# Patient Record
Sex: Female | Born: 1960 | Race: White | Hispanic: No | Marital: Single | Smoking: Current every day smoker
Health system: Southern US, Community
[De-identification: ages and names within clinical notes are randomized; demographics above are authoritative.]

## PROBLEM LIST (undated history)

## (undated) DIAGNOSIS — Z8709 Personal history of other diseases of the respiratory system: Secondary | ICD-10-CM

## (undated) DIAGNOSIS — J449 Chronic obstructive pulmonary disease, unspecified: Secondary | ICD-10-CM

## (undated) DIAGNOSIS — G894 Chronic pain syndrome: Secondary | ICD-10-CM

## (undated) DIAGNOSIS — J40 Bronchitis, not specified as acute or chronic: Secondary | ICD-10-CM

## (undated) DIAGNOSIS — Z7901 Long term (current) use of anticoagulants: Secondary | ICD-10-CM

## (undated) DIAGNOSIS — G5632 Lesion of radial nerve, left upper limb: Secondary | ICD-10-CM

## (undated) DIAGNOSIS — D6862 Lupus anticoagulant syndrome: Secondary | ICD-10-CM

## (undated) DIAGNOSIS — M810 Age-related osteoporosis without current pathological fracture: Secondary | ICD-10-CM

## (undated) DIAGNOSIS — I82409 Acute embolism and thrombosis of unspecified deep veins of unspecified lower extremity: Secondary | ICD-10-CM

## (undated) HISTORY — DX: Lupus anticoagulant syndrome: D68.62

## (undated) HISTORY — DX: Lesion of radial nerve, left upper limb: G56.32

## (undated) HISTORY — PX: LEG AMPUTATION BELOW KNEE: SHX694

## (undated) HISTORY — PX: GASTRIC BYPASS: SHX52

## (undated) HISTORY — PX: TONSILLECTOMY: SUR1361

## (undated) HISTORY — DX: Age-related osteoporosis without current pathological fracture: M81.0

## (undated) HISTORY — PX: CHOLECYSTECTOMY: SHX55

## (undated) HISTORY — DX: Personal history of other diseases of the respiratory system: Z87.09

## (undated) HISTORY — DX: Chronic pain syndrome: G89.4

---

## 2004-05-10 ENCOUNTER — Emergency Department (HOSPITAL_COMMUNITY): Admission: EM | Admit: 2004-05-10 | Discharge: 2004-05-10 | Payer: Self-pay | Admitting: Emergency Medicine

## 2004-05-18 ENCOUNTER — Emergency Department (HOSPITAL_COMMUNITY): Admission: EM | Admit: 2004-05-18 | Discharge: 2004-05-18 | Payer: Self-pay | Admitting: Emergency Medicine

## 2004-10-23 ENCOUNTER — Ambulatory Visit: Payer: Self-pay | Admitting: Family Medicine

## 2004-11-02 ENCOUNTER — Ambulatory Visit: Payer: Self-pay | Admitting: Internal Medicine

## 2004-11-09 ENCOUNTER — Ambulatory Visit: Payer: Self-pay | Admitting: Family Medicine

## 2004-11-20 ENCOUNTER — Ambulatory Visit: Payer: Self-pay | Admitting: Family Medicine

## 2008-12-24 ENCOUNTER — Ambulatory Visit: Payer: Self-pay | Admitting: Radiology

## 2008-12-24 ENCOUNTER — Emergency Department (HOSPITAL_BASED_OUTPATIENT_CLINIC_OR_DEPARTMENT_OTHER): Admission: EM | Admit: 2008-12-24 | Discharge: 2008-12-24 | Payer: Self-pay | Admitting: Emergency Medicine

## 2009-02-10 ENCOUNTER — Inpatient Hospital Stay (HOSPITAL_COMMUNITY): Admission: EM | Admit: 2009-02-10 | Discharge: 2009-02-10 | Payer: Self-pay | Admitting: Internal Medicine

## 2009-02-10 ENCOUNTER — Ambulatory Visit: Payer: Self-pay | Admitting: Diagnostic Radiology

## 2009-02-10 ENCOUNTER — Ambulatory Visit: Payer: Self-pay | Admitting: *Deleted

## 2009-02-10 ENCOUNTER — Encounter (INDEPENDENT_AMBULATORY_CARE_PROVIDER_SITE_OTHER): Payer: Self-pay | Admitting: Internal Medicine

## 2009-02-10 ENCOUNTER — Encounter: Payer: Self-pay | Admitting: Emergency Medicine

## 2010-10-10 LAB — DIFFERENTIAL
Basophils Absolute: 0.1 K/uL (ref 0.0–0.1)
Basophils Relative: 1 % (ref 0–1)
Eosinophils Absolute: 0.1 K/uL (ref 0.0–0.7)
Eosinophils Relative: 1 % (ref 0–5)
Lymphocytes Relative: 25 % (ref 12–46)
Lymphs Abs: 1.7 K/uL (ref 0.7–4.0)
Monocytes Absolute: 0.6 K/uL (ref 0.1–1.0)
Monocytes Relative: 9 % (ref 3–12)
Neutro Abs: 4.2 K/uL (ref 1.7–7.7)
Neutrophils Relative %: 63 % (ref 43–77)

## 2010-10-10 LAB — POCT CARDIAC MARKERS
CKMB, poc: <1 ng/mL — ABNORMAL LOW (ref 1.0–8.0)
Myoglobin, poc: 26.1 ng/mL (ref 12–200)
Troponin i, poc: <0.05 ng/mL (ref 0.00–0.09)

## 2010-10-10 LAB — CBC
HCT: 39 % (ref 36.0–46.0)
Hemoglobin: 13.5 g/dL (ref 12.0–15.0)
MCHC: 34.5 g/dL (ref 30.0–36.0)
MCV: 95.8 fL (ref 78.0–100.0)
RDW: 13.9 % (ref 11.5–15.5)

## 2010-10-10 LAB — PROTIME-INR
INR: 1 (ref 0.00–1.49)
Prothrombin Time: 13.4 s (ref 11.6–15.2)

## 2010-10-10 LAB — BASIC METABOLIC PANEL
CO2: 28 mEq/L (ref 19–32)
Chloride: 104 mEq/L (ref 96–112)
Glucose, Bld: 85 mg/dL (ref 70–99)
Potassium: 4 mEq/L (ref 3.5–5.1)
Sodium: 140 mEq/L (ref 135–145)

## 2010-10-10 LAB — APTT: aPTT: 24 s (ref 24–37)

## 2010-11-17 NOTE — H&P (Signed)
NAMEJOLI, KOOB         ACCOUNT NO.:  [id number]   MEDICAL RECORD NO.:  [id number]          PATIENT TYPE:  INP   LOCATION:                               FACILITY:  [facility]   PHYSICIAN:  Renee Ramus, MD       DATE OF BIRTH:  June 13, 1961   DATE OF ADMISSION:  02/10/2009  DATE OF DISCHARGE:  02/10/2009                              HISTORY & PHYSICAL   HISTORY OF PRESENT ILLNESS:  The patient is a 50 year old female with  previous history of lower extremity clot.  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  The patient was supposed to  be on a Coumadin regimen for lifelong treatment, but stopped her  Coumadin secondary to financial concerns.  The patient has been off  Coumadin for several months.  She noticed that her left lower extremity  became swollen and painful.  The patient presented to the emergency  department and there she was thought to have had a DVT.  She was  transferred to [facility].  She received Doppler ultrasound and DVT was  confirmed.  The patient is now being discharged home with instructions  to follow up with her primary care physician regarding PT/INR checks.  The patient will be on Coumadin lifelong.   PAST MEDICAL HISTORY:  1. DVT.  2. Chronic back pain.   SOCIAL HISTORY:  The patient denies alcohol use, but admits to smoking  1/2 to 1 pack per day.   FAMILY HISTORY:  Not available.   REVIEW OF SYSTEMS:  All other comprehensive review systems are negative.   MEDICATIONS:  The patient has no known drug allergies.   CURRENT MEDICATIONS:  1. Coumadin which she has not been taking.  2. Albuterol 2 puffs inhaled q.6 h. p.r.n. respiratory distress.   PHYSICAL EXAMINATION:  GENERAL:  This is a well-developed, well-  nourished white female, currently in no apparent distress.  VITAL SIGNS:  Blood pressure 121/86, heart rate 72, respiratory rate 18,  and temperature 98.3.  She is 98% sat on room air.  HEENT:  No jugular  venous distention or lymphadenopathy.  Oropharynx is  clear.  Mucous membranes are pink and moist.  TMs are clear bilaterally.  Pupils are equal and reactive to light and accommodation.  Extraocular  muscles are intact.  CARDIOVASCULAR:  Regular rate and rhythm without murmurs, rubs, or  gallops.  PULMONARY:  Lungs are clear to auscultation bilaterally.  ABDOMEN:  Soft, nontender, and nondistended without hepatosplenomegaly.  Bowel sounds are present.  She has no rebound or guarding.  EXTREMITIES:  No clubbing, cyanosis, or edema.  NEURO:  Cranial nerves II-XII are grossly intact.  She has no focal  neurological deficits.   STUDIES:  1. Lower extremity ultrasound showing clot present in the common      femoral vein and in the left lower extremity.  All other veins      appeared thrombosis free.  2. Chest x-ray shows no acute disease.   LABORATORY DATA:  White count 6.7, H&H 13.5 and 39, MCV 95, and  platelets 129.  Sodium 140, potassium 4.0, chloride 104, bicarb  28, BUN  7, creatinine 0.6, and glucose 85.   ASSESSMENT/PLAN:  1. Deep vein thrombosis:  As above, we will discharge the patient to      home with follow up with primary care physician.  She understands      the nature of this treatment.  She is comfortable with this plan.  2. Chronic back pain, currently stable.  3. Tobacco use:  Counsel the patient with respect to cigarette      smoking.  4. Disposition:  The patient will be discharged now.   PRIMARY DISCHARGE DIAGNOSIS:  Lower extremity deep vein thrombosis.   SECONDARY DIAGNOSES:  1. Chronic back pain.  2. Tobacco abuse.   DISCHARGE MEDICATIONS:  1. Albuterol 1-2 puffs inhaled q.6 h. p.r.n. pain.  2. Vicodin 5/500 one to two p.o. q.6 h. p.r.n. pain.  3. Coumadin 5 mg p.o. daily.  4. Lovenox 80 mg subcu b.i.d.   There are no labs or studies pending at the time of discharge.  The  patient is in stable condition and anxious for discharge.   Time spent 35  minutes.      Renee Ramus, MD  Electronically Signed     JF/MEDQ  D:  02/10/2009  T:  02/11/2009  Job:  [id number]

## 2011-06-01 ENCOUNTER — Ambulatory Visit (HOSPITAL_BASED_OUTPATIENT_CLINIC_OR_DEPARTMENT_OTHER)
Admission: RE | Admit: 2011-06-01 | Discharge: 2011-06-01 | Disposition: A | Discharge status: Home / Self Care | Payer: Private Health Insurance - Indemnity | Source: Ambulatory Visit | Attending: Internal Medicine | Admitting: Internal Medicine

## 2011-06-01 ENCOUNTER — Other Ambulatory Visit (HOSPITAL_BASED_OUTPATIENT_CLINIC_OR_DEPARTMENT_OTHER): Payer: Self-pay | Admitting: Internal Medicine

## 2011-06-01 DIAGNOSIS — I82409 Acute embolism and thrombosis of unspecified deep veins of unspecified lower extremity: Secondary | ICD-10-CM

## 2011-06-01 DIAGNOSIS — M7989 Other specified soft tissue disorders: Secondary | ICD-10-CM | POA: Insufficient documentation

## 2011-06-01 DIAGNOSIS — Z86718 Personal history of other venous thrombosis and embolism: Secondary | ICD-10-CM

## 2011-06-01 DIAGNOSIS — Z7901 Long term (current) use of anticoagulants: Secondary | ICD-10-CM

## 2011-06-01 DIAGNOSIS — M79609 Pain in unspecified limb: Secondary | ICD-10-CM | POA: Insufficient documentation

## 2012-11-12 IMAGING — US US EXTREM LOW VENOUS*R*
1 series · 14 of 24 positions shown · non-contrast
Comparison: None.

CLINICAL DATA: Severe pain, redness and swelling in the dorsum of
the right foot for the past week.  History of multiple left leg
DVTs.  The patient takes Warfarin.



[Series 1: us extrem low venous*right* · 14 of 28 slices shown]
[im 1/28]
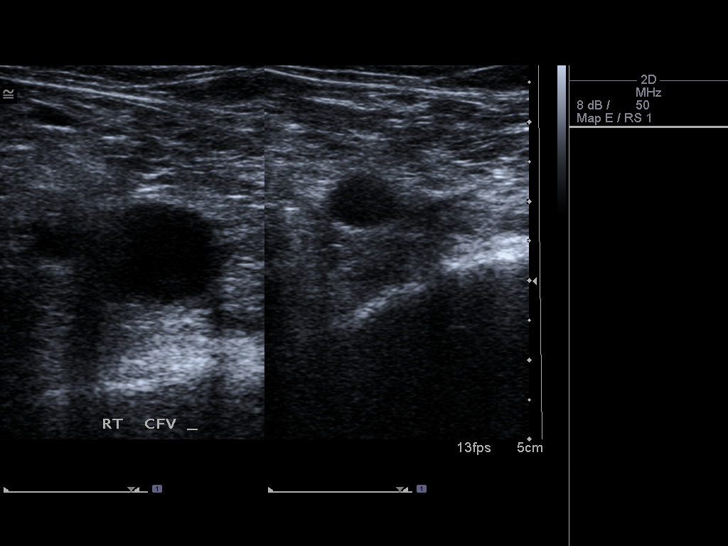
[im 3/28]
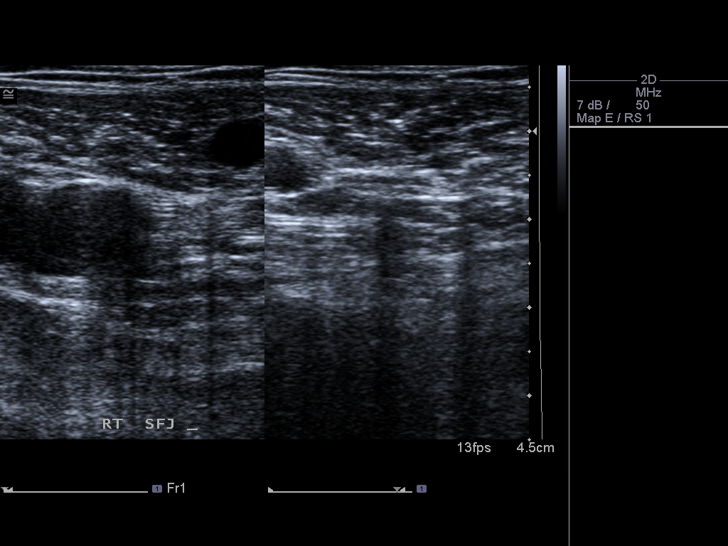
[im 5/28]
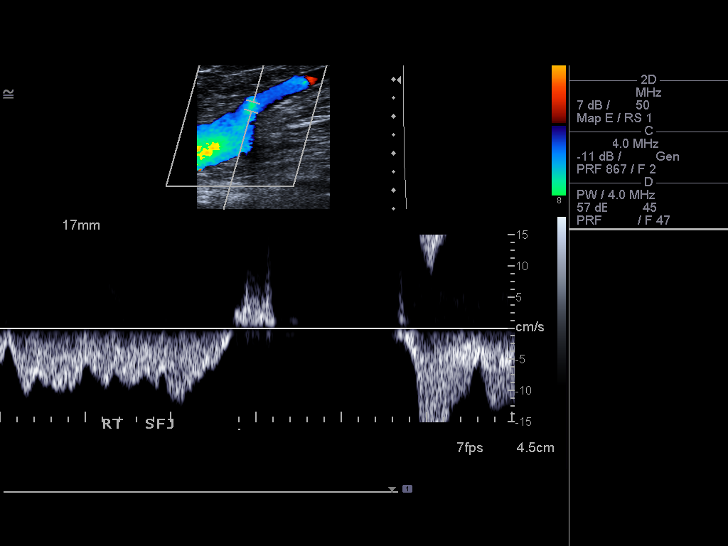
[im 8/28]
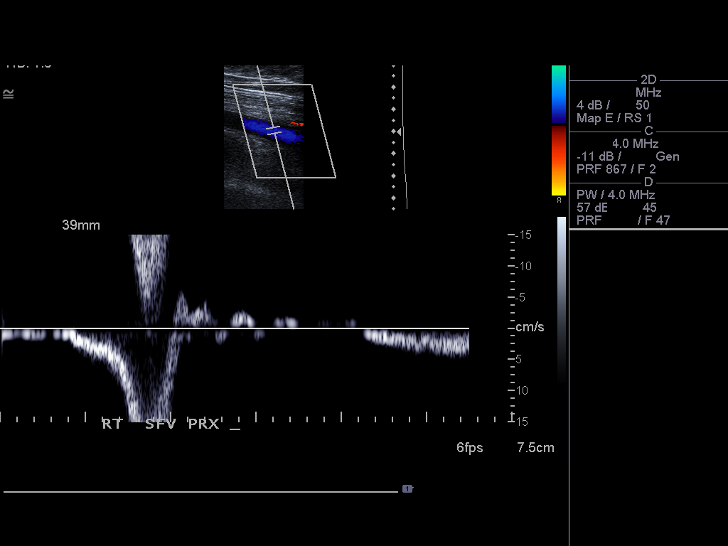
[im 9/28]
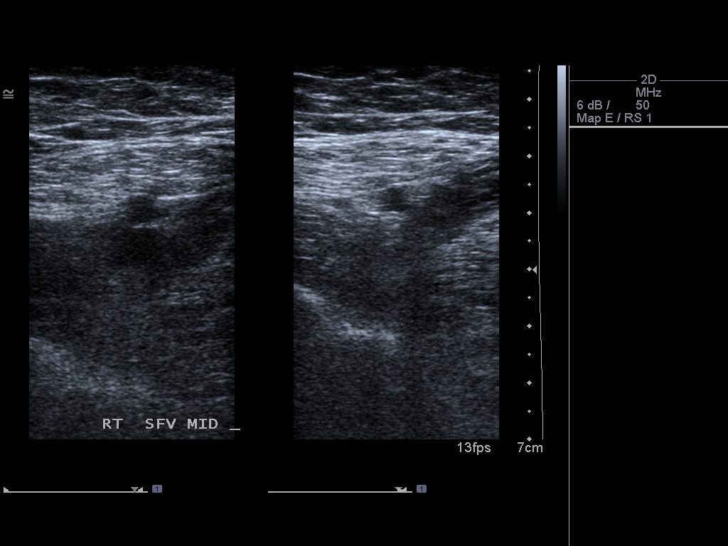
[im 11/28]
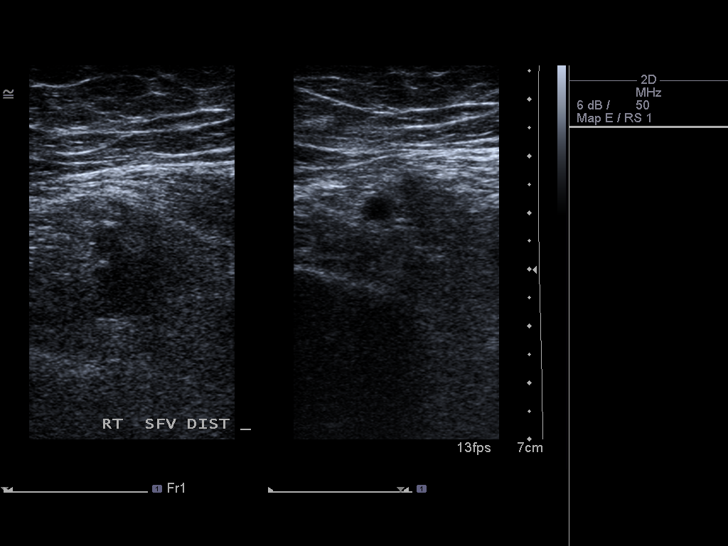
[im 13/28]
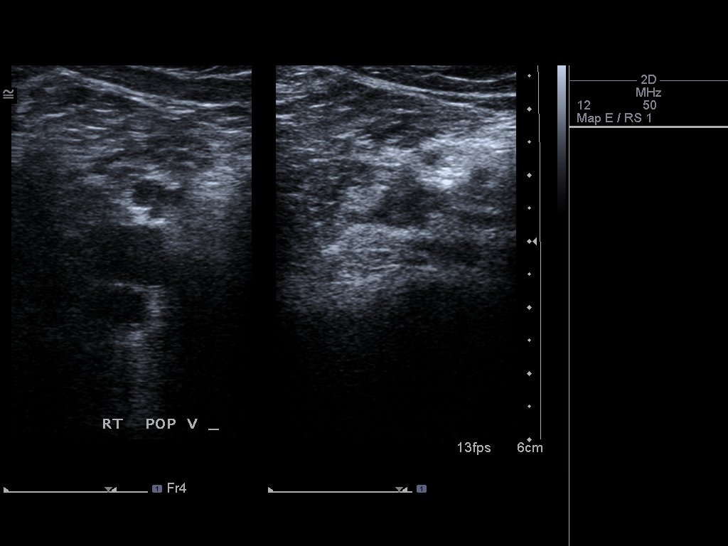
[im 15/28]
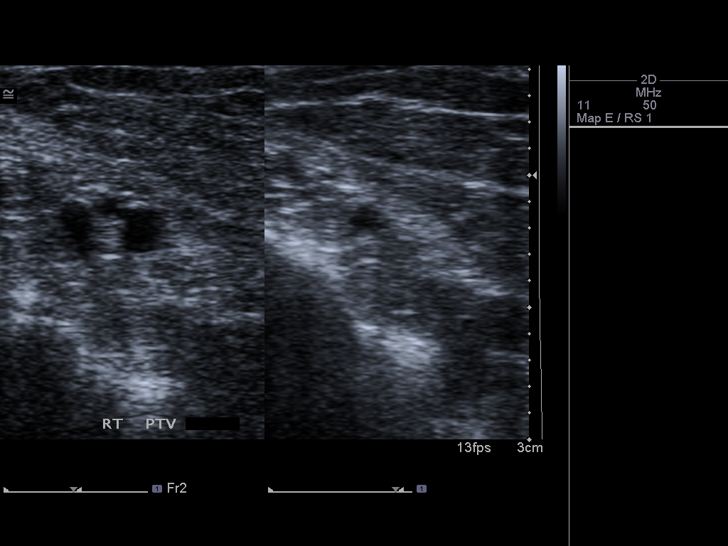
[im 17/28]
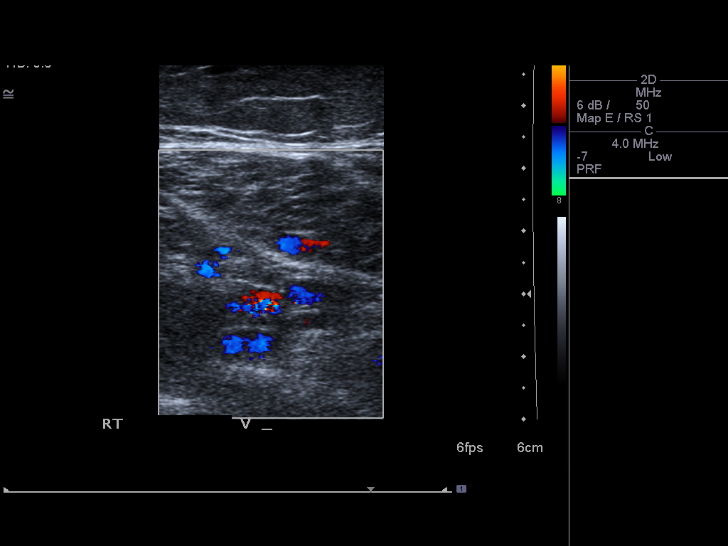
[im 19/28]
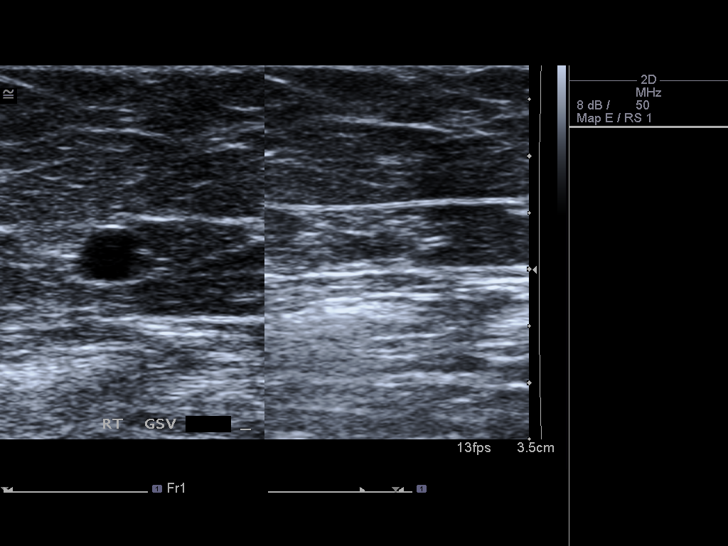
[im 22/28]
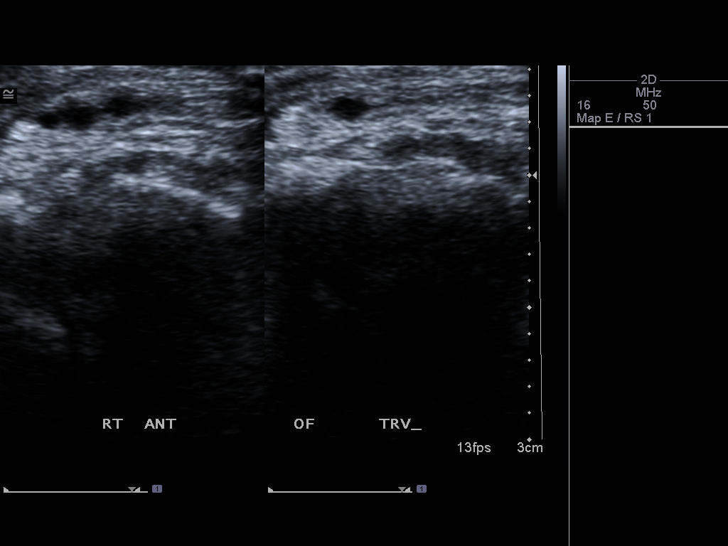
[im 23/28]
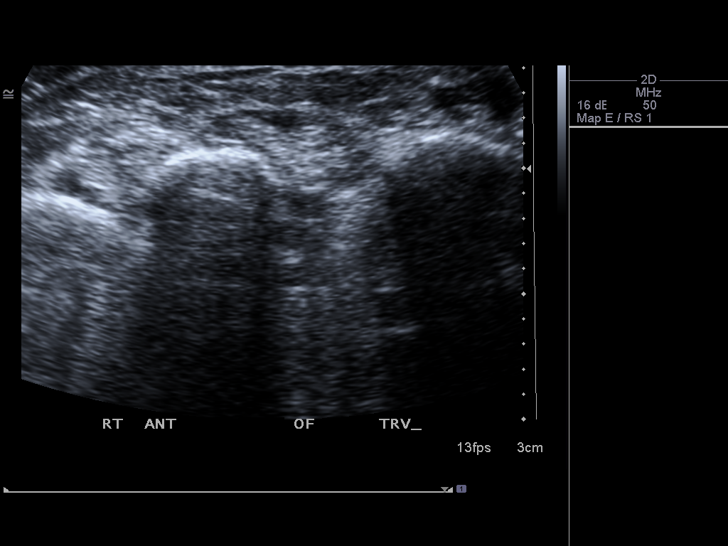
[im 25/28]
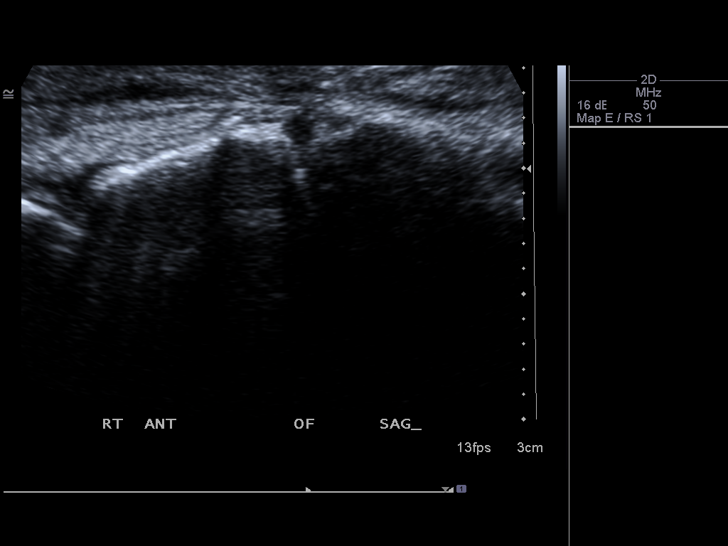
[im 28/28]
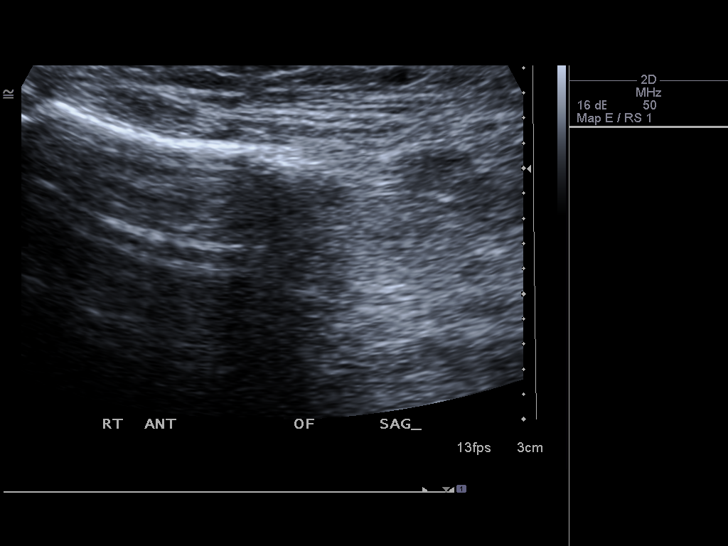

[14 of 24 positions shown; findings below may reference images not displayed]

FINDINGS: Normal compressibility of  the right common femoral,
superficial femoral, and popliteal veins is demonstrated, as well
as the visualized proximal calf veins.  No filling defects to
suggest DVT on grayscale or color Doppler imaging.  Doppler
waveforms show normal direction of venous flow, normal respiratory
phasicity and response to augmentation.
IMPRESSION: No evidence of  right lower extremity deep vein thrombosis.

## 2013-04-25 ENCOUNTER — Encounter (HOSPITAL_COMMUNITY): Payer: Self-pay | Admitting: Emergency Medicine

## 2013-04-25 ENCOUNTER — Emergency Department (HOSPITAL_COMMUNITY): Payer: Managed Care, Other (non HMO)

## 2013-04-25 ENCOUNTER — Emergency Department (HOSPITAL_COMMUNITY)
Admission: EM | Admit: 2013-04-25 | Discharge: 2013-04-25 | Disposition: A | Discharge status: Home / Self Care | Payer: Managed Care, Other (non HMO) | Attending: Emergency Medicine | Admitting: Emergency Medicine

## 2013-04-25 DIAGNOSIS — R209 Unspecified disturbances of skin sensation: Secondary | ICD-10-CM | POA: Insufficient documentation

## 2013-04-25 DIAGNOSIS — M6281 Muscle weakness (generalized): Secondary | ICD-10-CM | POA: Diagnosis present

## 2013-04-25 DIAGNOSIS — R51 Headache: Secondary | ICD-10-CM | POA: Insufficient documentation

## 2013-04-25 DIAGNOSIS — J449 Chronic obstructive pulmonary disease, unspecified: Secondary | ICD-10-CM | POA: Insufficient documentation

## 2013-04-25 DIAGNOSIS — J4489 Other specified chronic obstructive pulmonary disease: Secondary | ICD-10-CM | POA: Insufficient documentation

## 2013-04-25 DIAGNOSIS — Z86718 Personal history of other venous thrombosis and embolism: Secondary | ICD-10-CM | POA: Diagnosis not present

## 2013-04-25 DIAGNOSIS — Z7901 Long term (current) use of anticoagulants: Secondary | ICD-10-CM | POA: Insufficient documentation

## 2013-04-25 DIAGNOSIS — Z79899 Other long term (current) drug therapy: Secondary | ICD-10-CM | POA: Insufficient documentation

## 2013-04-25 DIAGNOSIS — F17200 Nicotine dependence, unspecified, uncomplicated: Secondary | ICD-10-CM | POA: Insufficient documentation

## 2013-04-25 DIAGNOSIS — R29898 Other symptoms and signs involving the musculoskeletal system: Secondary | ICD-10-CM

## 2013-04-25 HISTORY — DX: Bronchitis, not specified as acute or chronic: J40

## 2013-04-25 HISTORY — DX: Chronic obstructive pulmonary disease, unspecified: J44.9

## 2013-04-25 HISTORY — DX: Acute embolism and thrombosis of unspecified deep veins of unspecified lower extremity: I82.409

## 2013-04-25 LAB — COMPREHENSIVE METABOLIC PANEL
AST: 21 U/L (ref 0–37)
Albumin: 3.3 g/dL — ABNORMAL LOW (ref 3.5–5.2)
Alkaline Phosphatase: 98 U/L (ref 39–117)
BUN: 5 mg/dL — ABNORMAL LOW (ref 6–23)
CO2: 27 mEq/L (ref 19–32)
Chloride: 96 mEq/L (ref 96–112)
Creatinine, Ser: 0.42 mg/dL — ABNORMAL LOW (ref 0.50–1.10)
GFR calc Af Amer: >90 mL/min (ref >90)
GFR calc non Af Amer: >90 mL/min (ref >90)
Potassium: 3.6 mEq/L (ref 3.5–5.1)
Total Bilirubin: 0.3 mg/dL (ref 0.3–1.2)

## 2013-04-25 LAB — POCT I-STAT TROPONIN I: Troponin i, poc: 0 ng/mL (ref 0.00–0.08)

## 2013-04-25 LAB — RAPID URINE DRUG SCREEN, HOSP PERFORMED
Amphetamines: NOT DETECTED
Barbiturates: NOT DETECTED
Opiates: NOT DETECTED
Tetrahydrocannabinol: NOT DETECTED

## 2013-04-25 LAB — DIFFERENTIAL
Basophils Relative: 1 % (ref 0–1)
Eosinophils Relative: 1 % (ref 0–5)
Monocytes Relative: 6 % (ref 3–12)
Neutro Abs: 4.8 10*3/uL (ref 1.7–7.7)
Neutrophils Relative %: 69 % (ref 43–77)

## 2013-04-25 LAB — CBC
Hemoglobin: 10 g/dL — ABNORMAL LOW (ref 12.0–15.0)
MCHC: 30.1 g/dL (ref 30.0–36.0)
Platelets: 426 10*3/uL — ABNORMAL HIGH (ref 150–400)
RBC: 4.39 MIL/uL (ref 3.87–5.11)

## 2013-04-25 LAB — PROTIME-INR
INR: 2.29 — ABNORMAL HIGH (ref 0.00–1.49)
Prothrombin Time: 24.5 seconds — ABNORMAL HIGH (ref 11.6–15.2)

## 2013-04-25 LAB — URINALYSIS, ROUTINE W REFLEX MICROSCOPIC
Protein, ur: NEGATIVE mg/dL
Specific Gravity, Urine: 1.018 (ref 1.005–1.030)
Urobilinogen, UA: 1 mg/dL (ref 0.0–1.0)
pH: 6 (ref 5.0–8.0)

## 2013-04-25 NOTE — ED Notes (Signed)
Pt states that she woke up yesterday morning with left arm weakness and tingling, pin and needles.  Pt states that she isnt able to grip or hold anything with left hand.

## 2013-04-25 NOTE — ED Notes (Signed)
Bed: WA07 Expected date:  Expected time:  Means of arrival:  Comments: Hal E

## 2013-04-25 NOTE — ED Provider Notes (Signed)
CSN: [id number]     Arrival date & time 04/25/13  1400 History   First MD Initiated Contact with Patient 04/25/13 1623     Chief Complaint  Patient presents with  . left arm weakness     HPI Patient presents to the emergency room complaining of left arm weakness associated with tingling. The symptoms started yesterday. Patient noticed that she was having trouble holding objects with her left hand and moving her wrist. She went and saw her primary care doctor who performed a CT scan.  It was normal and the patient was supposed to follow up with her neurologist who she primarily sees for pain management.  Patient called the office there and was told to come to the emergency room. Patient denies any headache, trouble with her speech, trouble with her balance or coordination. She denies any difficulty in her other extremities.  Patient notices primarily she has trouble extending her left breast. She denies any neck pain. Past Medical History  Diagnosis Date  . DVT (deep venous thrombosis)   . COPD (chronic obstructive pulmonary disease)   . Bronchitis    Past Surgical History  Procedure Laterality Date  . Leg amputation below knee     No family history on file. History  Substance Use Topics  . Smoking status: Current Every Day Smoker    Types: Cigarettes  . Smokeless tobacco: Not on file  . Alcohol Use: No   OB History   Grav Para Term Preterm Abortions TAB SAB Ect Mult Living                 Review of Systems  Constitutional: Negative for fever.  HENT: Negative for drooling.   Respiratory: Negative for shortness of breath.   Cardiovascular: Negative for chest pain.  Gastrointestinal: Negative for abdominal pain.  Neurological: Positive for headaches. Negative for tremors, seizures and speech difficulty.       Yesterday she had one but none today  All other systems reviewed and are negative.    Allergies  Review of patient's allergies indicates no known allergies.  Home  Medications   Current Outpatient Rx  Name  Route  Sig  Dispense  Refill  . acetaminophen (TYLENOL) 500 MG tablet   Oral   Take 500 mg by mouth every 6 (six) hours as needed for pain.         Marland Kitchen ADVAIR DISKUS 500-50 MCG/DOSE AEPB   Inhalation   Inhale 1 puff into the lungs 2 (two) times daily.          . DULoxetine (CYMBALTA) 60 MG capsule   Oral   Take 60 mg by mouth daily.          . fentaNYL (DURAGESIC - DOSED MCG/HR) 50 MCG/HR   Transdermal   Place 1 patch onto the skin every 3 (three) days.          Marland Kitchen gabapentin (NEURONTIN) 600 MG tablet   Oral   Take 600 mg by mouth 2 (two) times daily.          Marland Kitchen ipratropium-albuterol (DUONEB) 0.5-2.5 (3) MG/3ML SOLN   Inhalation   Inhale 3 mLs into the lungs every 4 (four) hours as needed (wheezing).          Marland Kitchen oxyCODONE (ROXICODONE) 15 MG immediate release tablet   Oral   Take 15 mg by mouth every 8 (eight) hours as needed for pain.          . VENTOLIN HFA 108 (  90 BASE) MCG/ACT inhaler   Inhalation   Inhale 2 puffs into the lungs every 4 (four) hours as needed for wheezing.          . Vitamin D, Ergocalciferol, (DRISDOL) 50000 UNITS CAPS capsule   Oral   Take 50,000 Units by mouth every Friday.          . warfarin (COUMADIN) 5 MG tablet   Oral   Take 2.5 mg by mouth daily.           BP 117/61  Pulse 93  Temp(Src) 98 F (36.7 C) (Oral)  Resp 16  SpO2 92% Physical Exam  Nursing note and vitals reviewed. Constitutional: She is oriented to person, place, and time. She appears well-developed and well-nourished. No distress.  HENT:  Head: Normocephalic and atraumatic.  Right Ear: External ear normal.  Left Ear: External ear normal.  Mouth/Throat: Oropharynx is clear and moist.  Eyes: Conjunctivae are normal. Right eye exhibits no discharge. Left eye exhibits no discharge. No scleral icterus.  Neck: Neck supple. No tracheal deviation present.  Cardiovascular: Normal rate, regular rhythm and intact  distal pulses.   Pulmonary/Chest: Effort normal and breath sounds normal. No stridor. No respiratory distress. She has no wheezes. She has no rales.  Abdominal: Soft. Bowel sounds are normal. She exhibits no distension. There is no tenderness. There is no rebound and no guarding.  Musculoskeletal: She exhibits no edema and no tenderness.  S/p amputation rle  Neurological: She is alert and oriented to person, place, and time. She has normal strength. No sensory deficit. Cranial nerve deficit:  no gross defecits noted. She exhibits normal muscle tone. She displays no seizure activity. Coordination normal.  Equal grip strength bilateral UE, weakness in extension of right wrist, able to hold both legs off bed for 5 seconds, sensation intact in all extremities, no visual field cuts, no left or right sided neglect  Skin: Skin is warm and dry. No rash noted.  Psychiatric: She has a normal mood and affect.    ED Course  Procedures (including critical care time) Labs Review Labs Reviewed  PROTIME-INR - Abnormal; Notable for the following:    Prothrombin Time 24.5 (*)    INR 2.29 (*)    All other components within normal limits  APTT - Abnormal; Notable for the following:    aPTT 56 (*)    All other components within normal limits  CBC - Abnormal; Notable for the following:    Hemoglobin 10.0 (*)    HCT 33.2 (*)    MCV 75.6 (*)    MCH 22.8 (*)    RDW 19.0 (*)    Platelets 426 (*)    All other components within normal limits  COMPREHENSIVE METABOLIC PANEL - Abnormal; Notable for the following:    Sodium 132 (*)    Glucose, Bld 101 (*)    BUN 5 (*)    Creatinine, Ser 0.42 (*)    Albumin 3.3 (*)    All other components within normal limits  ETHANOL  DIFFERENTIAL  TROPONIN I  URINE RAPID DRUG SCREEN (HOSP PERFORMED)  URINALYSIS, ROUTINE W REFLEX MICROSCOPIC  POCT I-STAT TROPONIN I   Imaging Review Mr Brain Wo Contrast  04/25/2013   CLINICAL DATA:  52 year old female with acute onset  tingling and numbness in the left arm.  EXAM: MRI HEAD WITHOUT CONTRAST  TECHNIQUE: Multiplanar, multisequence MR imaging was performed. No intravenous contrast was administered.  COMPARISON:  [facility] brain MRI 10/30/2004.  FINDINGS: Major intracranial vascular flow voids are stable and within normal limits. Stable cerebral volume. No restricted diffusion to suggest acute infarction. No midline shift, mass effect, evidence of mass lesion, ventriculomegaly, extra-axial collection or acute intracranial hemorrhage. Cervicomedullary junction and pituitary are within normal limits. Negative visualized upper cervical spine.  Scattered small cerebral white matter T2 and FLAIR hyperintense foci are advanced for age, and increased since 2006, but nonspecific. Evidence of a small chronic micro hemorrhage in the left parietal lobe on series 7, image 17. No cortical encephalomalacia. Deep gray matter nuclei, brainstem and cerebellum within normal limits. Visible internal auditory structures appear normal.  Fluid level in the left maxillary sinus. Small volume retained secretions in the nasopharynx. Trace mastoid effusions greater on the left. Mild to moderate right ethmoid and maxillary sinus mucosal thickening.  Visualized orbit soft tissues are within normal limits. Normal bone marrow signal. Visualized scalp soft tissues are within normal limits.  IMPRESSION: 1. No acute intracranial abnormality. Increased nonspecific cerebral white matter signal changes since 2006. Favor chronic small vessel disease.  2. Paranasal sinus inflammatory changes compatible with acute sinusitis. Trace associated mastoid effusions.   Electronically Signed   By: Augusto Gamble M.D.   On: 04/25/2013 18:51   Mr Cervical Spine Wo Contrast  04/25/2013   CLINICAL DATA:  52 year old female with acute onset tingling and numbness in the left arm. Initial encounter.  EXAM: MRI CERVICAL SPINE WITHOUT CONTRAST  TECHNIQUE: Multiplanar,  multisequence MR imaging was performed. No intravenous contrast was administered.  COMPARISON:  None.  FINDINGS: Study is mildly to moderately degraded by motion artifact despite repeated imaging attempts.  Preserved cervical lordosis. Mild endplate marrow edema greater on the right at the posterior C5-C6 endplates appears to be degenerative in nature. Otherwise no acute osseous abnormality.  Cervicomedullary junction is within normal limits. Grossly negative paraspinal soft tissues.  C2-C3: Negative.  C3-C4: Negative.  C4-C5: Mild disc osteophyte complex and ligament flavum hypertrophy. Borderline to mild spinal stenosis. No foraminal stenosis.  C5-C6: Disc osteophyte complex and mild to moderate ligament flavum hypertrophy. Spinal stenosis, no definite spinal cord mass effect or signal abnormality. Right greater than left facet hypertrophy. Mild to moderate C6 foraminal stenosis.  C6-C7: No definite disc degeneration. Moderate ligament flavum hypertrophy. Mild spinal stenosis. No foraminal stenosis.  C7-T1: Negative. No upper thoracic spinal stenosis.  IMPRESSION: Degraded by motion despite repeated imaging attempts, but there is multifactorial mild degenerative cervical spinal stenosis C4-C5 through C6-C7. Suspect up to moderate foraminal stenosis at the bilateral C6 nerve levels. Up to mild spinal cord mass effect is possible at C5-C6. No spinal cord signal abnormality is evident.   Electronically Signed   By: Augusto Gamble M.D.   On: 04/25/2013 18:41    EKG Interpretation     Ventricular Rate:  76 PR Interval:  177 QRS Duration: 85 QT Interval:  366 QTC Calculation: 412 R Axis:   64 Text Interpretation:  Sinus rhythm No significant change since last tracing            MDM   1. Weakness of wrist    The pt's MRIs do not show any stroke or significant cervical spine abnormality to account for her wrist weakness.  Her grip strength is still strong surprisingly.  Will place her in a splint and  refer to neurology for further evaluation.   Celene Kras, MD 04/25/13 [phone]

## 2013-04-25 NOTE — ED Notes (Signed)
Pt back from MRI; walked to the restroom; refused her EKG until she was able to use restroom.

## 2013-04-25 NOTE — ED Notes (Signed)
Unable to perform neuro checks at 1800 and 2000 due to pt being in MRI.

## 2013-05-14 ENCOUNTER — Encounter: Payer: Self-pay | Admitting: Neurology

## 2013-05-15 ENCOUNTER — Encounter: Payer: Self-pay | Admitting: Neurology

## 2013-05-15 ENCOUNTER — Ambulatory Visit (INDEPENDENT_AMBULATORY_CARE_PROVIDER_SITE_OTHER): Payer: Private Health Insurance - Indemnity | Admitting: Neurology

## 2013-05-15 VITALS — BP 106/70 | HR 91 | Ht 68.0 in | Wt 149.0 lb

## 2013-05-15 DIAGNOSIS — G5632 Lesion of radial nerve, left upper limb: Secondary | ICD-10-CM

## 2013-05-15 DIAGNOSIS — G5630 Lesion of radial nerve, unspecified upper limb: Secondary | ICD-10-CM

## 2013-05-15 HISTORY — DX: Lesion of radial nerve, left upper limb: G56.32

## 2013-05-15 NOTE — Patient Instructions (Signed)
Radial Nerve Palsy °Wrist drop is also known as radial nerve palsy. It is a condition in which you can not extend your wrist. This means if you are standing with your elbow bent at a right angle and with the top of your hand pointed at the ceiling, you can not hold your hand up. It falls toward the floor.  °This action of extending your wrist is caused by the muscles in the back of your arm. These muscles are controlled by the radial nerve. This means that anything affecting the radial nerve so it can not tell the muscles how to work will cause wrist drop. This is medically called radial nerve palsy. Also the radial nerve is a motor and sensory nerve so anything affecting it causes problems with movement and feeling. °CAUSES  °Some more common causes of wrist drop are: °· A break (fracture) of the large bone in the arm between your shoulder and your elbow (humerus). This is because the radial nerve winds around the humerus. °· Improper use of crutches causes this because the radial nerve runs through the armpit (axilla). Crutches which are too long can put pressure on the nerve. This is sometimes called crutch palsy. °· Falling asleep with your arm over a chair and supported on the back is a common cause. This is sometimes called Saturday Night Syndrome. °· Wrist drop can be associated with lead poisoning because of the effect of lead on the radial nerve. °SYMPTOMS  °The wrist drop is an obvious problem, but there may also be numbness in the back of the arm, forearm or hand which provides feeling in these areas by the radial nerve. There can be difficulty straightening out the elbow in addition to the wrist. There may be numbness, tingling, pain, burning sensations or other abnormal feelings. Symptoms depend entirely on where the radial nerve is injured. °DIAGNOSIS  °· Wrist drop is obvious just by looking at it. Your caregiver may make the diagnosis by taking your history and doing a couple tests. °· One test which  may be done is a nerve conduction study. This test shows if the radial nerve is conducting signals well. If not, it can determine where the nerve problem is. °· Sometimes X-ray studies are done. Your caregiver will determine if further testing needs to be done. °TREATMENT  °· Usually if the problem is found to be pressure on the nerve, simply removing the pressure will allow the nerve to go back to normal in a few weeks to a few months. Other treatments will depend upon the cause found. °· Only take over-the-counter or prescription medicines for pain, discomfort, or fever as directed by your caregiver. °· Sometimes seizure medications are used. °· Steroids are sometimes given to decrease swelling if it is thought to be a possible cause. °Document Released: 02/25/2006 Document Revised: 09/13/2011 Document Reviewed: 04/07/2006 °[employer]® Patient Information ©2014 [employer]. ° °

## 2013-05-15 NOTE — Progress Notes (Signed)
Reason for visit: Left wrist drop  Robin Prince is a 52 y.o. female  History of present illness:  Robin Prince is a 52 year old right-handed white female with a history of a lupus anticoagulant antibody. The patient is on chronic Coumadin for this reason. The patient developed sudden onset of left wrist drop 3 weeks prior to this evaluation. The patient indicates that she woke up with the problem, with some tingling sensations into the left thumb, and some achy pain into the upper arm on the left. The patient has undergone MRI evaluation of the brain and cervical spine. No evidence of an acute stroke was seen, and mild spinal stenosis is seen at the C4-5 through the C6-7 levels. No definite cord compression or nerve root compression was seen. The patient is sent to this office for further evaluation. The patient denies any problems with the lower extremities, and she denies any issues with the right arm. The patient does not have neck or shoulder discomfort. The patient has a right below-knee amputation, and she uses a cane for ambulation.  Past Medical History  Diagnosis Date  . DVT (deep venous thrombosis)   . COPD (chronic obstructive pulmonary disease)   . Bronchitis   . 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 Lupus anticoagulant disorder   . Osteoporosis   . Chronic pain syndrome     Phantom leg pain, right  . History of pleural effusion     Status post sclerotherapy    Past Surgical History  Procedure Laterality Date  . Leg amputation below knee    . Cholecystectomy    . Tonsillectomy    . Gastric bypass      Family History  Problem Relation Age of Onset  . Cancer Father   . Migraines Sister   . Stroke Sister     Social history:  reports that she has been smoking Cigarettes.  She has been smoking about 0.50 packs per day. She has never used smokeless tobacco. She reports that she does not drink alcohol or use illicit drugs.  Medications:  Current  Outpatient Prescriptions on File Prior to Visit  Medication Sig Dispense Refill  . ADVAIR DISKUS 500-50 MCG/DOSE AEPB Inhale 1 puff into the lungs 2 (two) times daily.       . DULoxetine (CYMBALTA) 60 MG capsule Take 60 mg by mouth daily.       . fentaNYL (DURAGESIC - DOSED MCG/HR) 50 MCG/HR Place 1 patch onto the skin every 3 (three) days.       Marland Kitchen gabapentin (NEURONTIN) 600 MG tablet Take 600 mg by mouth 2 (two) times daily.       Marland Kitchen ipratropium-albuterol (DUONEB) 0.5-2.5 (3) MG/3ML SOLN Inhale 3 mLs into the lungs every 4 (four) hours as needed (wheezing).       Marland Kitchen oxyCODONE (ROXICODONE) 15 MG immediate release tablet Take 15 mg by mouth every 8 (eight) hours as needed for pain.       . VENTOLIN HFA 108 (90 BASE) MCG/ACT inhaler Inhale 2 puffs into the lungs every 4 (four) hours as needed for wheezing.       . Vitamin D, Ergocalciferol, (DRISDOL) 50000 UNITS CAPS capsule Take 50,000 Units by mouth every Friday.       . warfarin (COUMADIN) 5 MG tablet Take 2.5 mg by mouth daily.       Marland Kitchen acetaminophen (TYLENOL) 500 MG tablet Take 500 mg by mouth every 6 (six) hours as needed for pain.      Marland Kitchen  alendronate (FOSAMAX) 70 MG tablet Take 70 mg by mouth once a week.       No current facility-administered medications on file prior to visit.     No Known Allergies  ROS:  Out of a complete 14 system review of symptoms, the patient complains only of the following symptoms, and all other reviewed systems are negative.  Weight loss, fatigue Swelling in the legs Shortness of breath, cough, wheezing Incontinence of bladder Anemia, easy bruising, easy bleeding Increased thirst Allergies, runny nose Numbness, weakness Insomnia, sleepiness  Blood pressure 106/70, pulse 91, height 5\' 8"  (1.727 m), weight 149 lb (67.586 kg).  Physical Exam  General: The patient is alert and cooperative at the time of the examination.  Head: Pupils are equal, round, and reactive to light. Discs are flat  bilaterally.  Neck: The neck is supple, no carotid bruits are noted.  Respiratory: The respiratory examination reveals bilateral wheezes and rhonchi.  Cardiovascular: The cardiovascular examination reveals a regular rate and rhythm, no obvious murmurs or rubs are noted.  Neuromuscular: The patient has full range of movement of the cervical spine.  Skin: Extremities are without significant edema, the patient has a right below-knee amputation. Prosthetic leg is in place.  Neurologic Exam  Mental status: The patient is alert and oriented x 3 the time of the examination.  Cranial nerves: Facial symmetry is present. There is good sensation of the face to pinprick and soft touch bilaterally. The strength of the facial muscles and the muscles to head turning and shoulder shrug are normal bilaterally. Speech is well enunciated, no aphasia or dysarthria is noted. Extraocular movements are full. Visual fields are full.  Motor: The motor testing reveals 5 over 5 strength of all 4 extremities, with exception that there is a wrist drop on the left, weakness with extension of the fingers, and the brachioradialis muscle on the left is not contract. The patient has normal triceps strength bilaterally. Good symmetric motor tone is noted throughout.  Sensory: Sensory testing is intact to pinprick, soft touch, vibration sensation, and position sense on all 4 extremities, with exception that there is some mild decrease in pinprick sensation on the left thumb. No evidence of extinction is noted.  Coordination: Cerebellar testing reveals good finger-nose-finger and heel-to-shin bilaterally.  Gait and station: Gait is of a limping quality, the patient has a right below-knee amputation, prosthetic leg. Tandem gait was not attempted. Romberg is negative. No drift is seen.  Reflexes: Deep tendon reflexes are symmetric and normal bilaterally, but the right ankle jerk reflex could not be tested. Toes are downgoing on  the left.   Assessment/Plan:  1. 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 Lupus anticoagulant antibody, chronic anticoagulation  The patient is on multiple medications that are sedating, and she has been oversedated while sleeping at night. The patient likely has a compressive neuropathy of the radial nerve around the spiral groove. The patient will be set up for nerve conduction studies of both upper extremities, and EMG evaluation of the left arm. Over time, the patient may get her strength back in the left hand over the next 3-6 months. Blood work will be done today.  Marlan Palau MD 05/15/2013 6:35 PM  [facility] [address] [place], [place] [address]  Phone [phone] Fax [phone]

## 2013-05-23 ENCOUNTER — Encounter: Payer: Private Health Insurance - Indemnity | Admitting: Neurology

## 2013-05-25 ENCOUNTER — Emergency Department (HOSPITAL_COMMUNITY)
Admission: EM | Admit: 2013-05-25 | Discharge: 2013-05-26 | Disposition: A | Discharge status: Home / Self Care | Payer: Managed Care, Other (non HMO) | Attending: Emergency Medicine | Admitting: Emergency Medicine

## 2013-05-25 ENCOUNTER — Emergency Department (HOSPITAL_COMMUNITY): Payer: Managed Care, Other (non HMO)

## 2013-05-25 ENCOUNTER — Encounter (HOSPITAL_COMMUNITY): Payer: Self-pay | Admitting: Emergency Medicine

## 2013-05-25 DIAGNOSIS — Z79899 Other long term (current) drug therapy: Secondary | ICD-10-CM | POA: Insufficient documentation

## 2013-05-25 DIAGNOSIS — G8929 Other chronic pain: Secondary | ICD-10-CM | POA: Insufficient documentation

## 2013-05-25 DIAGNOSIS — J4489 Other specified chronic obstructive pulmonary disease: Secondary | ICD-10-CM | POA: Insufficient documentation

## 2013-05-25 DIAGNOSIS — F17200 Nicotine dependence, unspecified, uncomplicated: Secondary | ICD-10-CM | POA: Insufficient documentation

## 2013-05-25 DIAGNOSIS — M25569 Pain in unspecified knee: Secondary | ICD-10-CM | POA: Insufficient documentation

## 2013-05-25 DIAGNOSIS — Z86718 Personal history of other venous thrombosis and embolism: Secondary | ICD-10-CM | POA: Insufficient documentation

## 2013-05-25 DIAGNOSIS — D6859 Other primary thrombophilia: Secondary | ICD-10-CM | POA: Insufficient documentation

## 2013-05-25 DIAGNOSIS — J449 Chronic obstructive pulmonary disease, unspecified: Secondary | ICD-10-CM | POA: Insufficient documentation

## 2013-05-25 DIAGNOSIS — M79604 Pain in right leg: Secondary | ICD-10-CM

## 2013-05-25 DIAGNOSIS — J189 Pneumonia, unspecified organism: Secondary | ICD-10-CM

## 2013-05-25 DIAGNOSIS — J159 Unspecified bacterial pneumonia: Secondary | ICD-10-CM | POA: Insufficient documentation

## 2013-05-25 DIAGNOSIS — G5630 Lesion of radial nerve, unspecified upper limb: Secondary | ICD-10-CM | POA: Insufficient documentation

## 2013-05-25 DIAGNOSIS — S88119A Complete traumatic amputation at level between knee and ankle, unspecified lower leg, initial encounter: Secondary | ICD-10-CM | POA: Insufficient documentation

## 2013-05-25 DIAGNOSIS — Z7901 Long term (current) use of anticoagulants: Secondary | ICD-10-CM | POA: Insufficient documentation

## 2013-05-25 DIAGNOSIS — M810 Age-related osteoporosis without current pathological fracture: Secondary | ICD-10-CM | POA: Insufficient documentation

## 2013-05-25 LAB — URINALYSIS, ROUTINE W REFLEX MICROSCOPIC
Glucose, UA: NEGATIVE mg/dL
Nitrite: NEGATIVE
Protein, ur: NEGATIVE mg/dL
pH: 6 (ref 5.0–8.0)

## 2013-05-25 LAB — CBC WITH DIFFERENTIAL/PLATELET
Basophils Absolute: 0.1 10*3/uL (ref 0.0–0.1)
Basophils Relative: 1 % (ref 0–1)
Eosinophils Relative: 2 % (ref 0–5)
HCT: 32.8 % — ABNORMAL LOW (ref 36.0–46.0)
Lymphs Abs: 2.8 10*3/uL (ref 0.7–4.0)
MCHC: 30.5 g/dL (ref 30.0–36.0)
MCV: 76.1 fL — ABNORMAL LOW (ref 78.0–100.0)
Monocytes Absolute: 0.7 10*3/uL (ref 0.1–1.0)
Neutro Abs: 9.4 10*3/uL — ABNORMAL HIGH (ref 1.7–7.7)
Neutrophils Relative %: 72 % (ref 43–77)
Platelets: 327 10*3/uL (ref 150–400)
RBC: 4.31 MIL/uL (ref 3.87–5.11)
RDW: 19.9 % — ABNORMAL HIGH (ref 11.5–15.5)

## 2013-05-25 LAB — COMPREHENSIVE METABOLIC PANEL
ALT: 9 U/L (ref 0–35)
AST: 15 U/L (ref 0–37)
Albumin: 3.1 g/dL — ABNORMAL LOW (ref 3.5–5.2)
CO2: 25 mEq/L (ref 19–32)
Calcium: 9.1 mg/dL (ref 8.4–10.5)
Chloride: 100 mEq/L (ref 96–112)
GFR calc non Af Amer: >90 mL/min (ref >90)
Sodium: 135 mEq/L (ref 135–145)

## 2013-05-25 LAB — LACTIC ACID, PLASMA: Lactic Acid, Venous: 1.1 mmol/L (ref 0.5–2.2)

## 2013-05-25 LAB — PROTIME-INR: Prothrombin Time: 30.9 seconds — ABNORMAL HIGH (ref 11.6–15.2)

## 2013-05-25 MED ORDER — DEXTROSE 5 % IV SOLN
1.0000 g | INTRAVENOUS | Status: DC
  Administered 2013-05-25: 1 g via INTRAVENOUS
  Filled 2013-05-25: qty 10

## 2013-05-25 MED ORDER — HYDROMORPHONE HCL PF 1 MG/ML IJ SOLN
1.0000 mg | Freq: Once | INTRAMUSCULAR | Status: AC
  Administered 2013-05-25: 1 mg via INTRAVENOUS
  Filled 2013-05-25: qty 1

## 2013-05-25 MED ORDER — AZITHROMYCIN 250 MG PO TABS: ORAL_TABLET | ORAL | Status: DC

## 2013-05-25 MED ORDER — SODIUM CHLORIDE 0.9 % IV BOLUS (SEPSIS)
500.0000 mL | Freq: Once | INTRAVENOUS | Status: AC
  Administered 2013-05-25: 500 mL via INTRAVENOUS

## 2013-05-25 MED ORDER — SODIUM CHLORIDE 0.9 % IV SOLN
INTRAVENOUS | Status: DC
  Administered 2013-05-25: 20 mL/h via INTRAVENOUS

## 2013-05-25 MED ORDER — SODIUM CHLORIDE 0.9 % IV BOLUS (SEPSIS)
1000.0000 mL | Freq: Once | INTRAVENOUS | Status: AC
  Administered 2013-05-25: 1000 mL via INTRAVENOUS

## 2013-05-25 MED ORDER — DEXTROSE 5 % IV SOLN
500.0000 mg | INTRAVENOUS | Status: DC
  Administered 2013-05-25: 500 mg via INTRAVENOUS

## 2013-05-25 MED ORDER — ALBUTEROL SULFATE HFA 108 (90 BASE) MCG/ACT IN AERS: 2.0000 | INHALATION_SPRAY | RESPIRATORY_TRACT | Status: AC | PRN

## 2013-05-25 MED ORDER — AMOXICILLIN 500 MG PO CAPS: 1000.0000 mg | ORAL_CAPSULE | Freq: Three times a day (TID) | ORAL | Status: DC

## 2013-05-25 NOTE — ED Notes (Signed)
Pt. For discharge, awaiting to complete IV antibiotic .

## 2013-05-25 NOTE — ED Provider Notes (Addendum)
CSN: [id number]     Arrival date & time 05/25/13  1747 History   First MD Initiated Contact with Patient 05/25/13 1926     Chief Complaint  Patient presents with  . abnormal bloodwork   . Blood Infection   (Consider location/radiation/quality/duration/timing/severity/associated sxs/prior Treatment) The history is provided by the patient.  pt c/o going to pcp w right stump pain for the past few days. Had labs done yesterday, was called today and told she may have an infection and to come to ED.  Pt states hx peripheral vascular disease, had arterial occlusion ?popliteal, had failed graft, and ended up with bka approximately 1 yr ago. States ever since surgery has had persistent pain in stump area, constant, moderate. States in past few days that pain is worse, prompting recent lab work and xr. Pt denies injury. No redness, increased swelling, or drainage. Pt also notes non productive cough, states has chronic cough, unsure of worse. No sore throat or runny nose. No fever or chills. +smoker    Past Medical History  Diagnosis Date  . DVT (deep venous thrombosis)   . COPD (chronic obstructive pulmonary disease)   . Bronchitis   . 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 Lupus anticoagulant disorder   . Osteoporosis   . Chronic pain syndrome     Phantom leg pain, right  . History of pleural effusion     Status post sclerotherapy   Past Surgical History  Procedure Laterality Date  . Leg amputation below knee    . Cholecystectomy    . Tonsillectomy    . Gastric bypass     Family History  Problem Relation Age of Onset  . Cancer Father   . Migraines Sister   . Stroke Sister    History  Substance Use Topics  . Smoking status: Current Every Day Smoker -- 0.50 packs/day    Types: Cigarettes  . Smokeless tobacco: Never Used  . Alcohol Use: No   OB History   Grav Para Term Preterm Abortions TAB SAB Ect Mult Living                 Review of Systems  Constitutional:  Negative for fever and chills.  HENT: Negative for sore throat.   Eyes: Negative for redness.  Respiratory: Positive for cough. Negative for shortness of breath.   Cardiovascular: Negative for chest pain.  Gastrointestinal: Negative for nausea, vomiting, abdominal pain and diarrhea.  Genitourinary: Negative for dysuria and flank pain.  Musculoskeletal: Negative for back pain and neck pain.  Skin: Negative for rash.  Neurological: Negative for headaches.  Hematological: Does not bruise/bleed easily.  Psychiatric/Behavioral: Negative for confusion.    Allergies  Review of patient's allergies indicates no known allergies.  Home Medications   Current Outpatient Rx  Name  Route  Sig  Dispense  Refill  . acetaminophen (TYLENOL) 500 MG tablet   Oral   Take 500 mg by mouth every 6 (six) hours as needed for pain.         Marland Kitchen ADVAIR DISKUS 500-50 MCG/DOSE AEPB   Inhalation   Inhale 1 puff into the lungs 2 (two) times daily.          . DULoxetine (CYMBALTA) 60 MG capsule   Oral   Take 60 mg by mouth daily.          . fentaNYL (DURAGESIC - DOSED MCG/HR) 50 MCG/HR   Transdermal   Place 1 patch onto the skin every 3 (three)  days.          . gabapentin (NEURONTIN) 600 MG tablet   Oral   Take 600 mg by mouth 2 (two) times daily.          Marland Kitchen ipratropium-albuterol (DUONEB) 0.5-2.5 (3) MG/3ML SOLN   Inhalation   Inhale 3 mLs into the lungs every 4 (four) hours as needed (wheezing).          Marland Kitchen oxyCODONE (ROXICODONE) 15 MG immediate release tablet   Oral   Take 15 mg by mouth every 8 (eight) hours as needed for pain.          . VENTOLIN HFA 108 (90 BASE) MCG/ACT inhaler   Inhalation   Inhale 2 puffs into the lungs every 4 (four) hours as needed for wheezing.          . Vitamin D, Ergocalciferol, (DRISDOL) 50000 UNITS CAPS capsule   Oral   Take 50,000 Units by mouth every Friday.          . warfarin (COUMADIN) 5 MG tablet   Oral   Take 5 mg by mouth daily.            BP 91/58  Pulse 86  Temp(Src) 98.4 F (36.9 C) (Oral)  Resp 20  SpO2 93% Physical Exam  Nursing note and vitals reviewed. Constitutional: She appears well-developed and well-nourished. No distress.  HENT:  Head: Atraumatic.  Nose: Nose normal.  Mouth/Throat: Oropharynx is clear and moist.  Eyes: Conjunctivae are normal. No scleral icterus.  Neck: Neck supple. No tracheal deviation present.  Cardiovascular: Normal rate, regular rhythm, normal heart sounds and intact distal pulses.  Exam reveals no gallop and no friction rub.   No murmur heard. Pulmonary/Chest: Effort normal and breath sounds normal. No respiratory distress.  Abdominal: Soft. Normal appearance. She exhibits no distension. There is no tenderness.  Genitourinary:  No cva tenderness  Musculoskeletal: She exhibits no edema and no tenderness.  Right bka, stump without cellulitis, swelling, drainage, or other sign of infection.   Neurological: She is alert.  Skin: Skin is warm and dry. No rash noted.  Psychiatric: She has a normal mood and affect.    ED Course  Procedures (including critical care time) Labs Review Results for orders placed during the hospital encounter of 05/25/13  COMPREHENSIVE METABOLIC PANEL      Result Value Range   Sodium 135  135 - 145 mEq/L   Potassium 3.5  3.5 - 5.1 mEq/L   Chloride 100  96 - 112 mEq/L   CO2 25  19 - 32 mEq/L   Glucose, Bld 78  70 - 99 mg/dL   BUN 11  6 - 23 mg/dL   Creatinine, Ser 4.54 (*) 0.50 - 1.10 mg/dL   Calcium 9.1  8.4 - 09.8 mg/dL   Total Protein 7.0  6.0 - 8.3 g/dL   Albumin 3.1 (*) 3.5 - 5.2 g/dL   AST 15  0 - 37 U/L   ALT 9  0 - 35 U/L   Alkaline Phosphatase 85  39 - 117 U/L   Total Bilirubin 0.2 (*) 0.3 - 1.2 mg/dL   GFR calc non Af Amer >90  >90 mL/min   GFR calc Af Amer >90  >90 mL/min  LACTIC ACID, PLASMA      Result Value Range   Lactic Acid, Venous 1.1  0.5 - 2.2 mmol/L  CBC WITH DIFFERENTIAL      Result Value Range   WBC 13.1 (*) 4.0 -  10.5 K/uL   RBC 4.31  3.87 - 5.11 MIL/uL   Hemoglobin 10.0 (*) 12.0 - 15.0 g/dL   HCT 16.1 (*) 09.6 - 04.5 %   MCV 76.1 (*) 78.0 - 100.0 fL   MCH 23.2 (*) 26.0 - 34.0 pg   MCHC 30.5  30.0 - 36.0 g/dL   RDW 40.9 (*) 81.1 - 91.4 %   Platelets 327  150 - 400 K/uL   Neutrophils Relative % 72  43 - 77 %   Neutro Abs 9.4 (*) 1.7 - 7.7 K/uL   Lymphocytes Relative 21  12 - 46 %   Lymphs Abs 2.8  0.7 - 4.0 K/uL   Monocytes Relative 5  3 - 12 %   Monocytes Absolute 0.7  0.1 - 1.0 K/uL   Eosinophils Relative 2  0 - 5 %   Eosinophils Absolute 0.2  0.0 - 0.7 K/uL   Basophils Relative 1  0 - 1 %   Basophils Absolute 0.1  0.0 - 0.1 K/uL  PROTIME-INR      Result Value Range   Prothrombin Time 30.9 (*) 11.6 - 15.2 seconds   INR 3.11 (*) 0.00 - 1.49   Dg Chest 2 View  05/25/2013   CLINICAL DATA:  Cough.  Recent bronchitis.  COPD.  EXAM: CHEST  2 VIEW  COMPARISON:  February 10, 2009  FINDINGS: The heart size and mediastinal contours are within normal limits. There is chronic blunting of right costophrenic angle unchanged. There is a 7 mm nodule in the left upper lobe unchanged compared to prior exam. There is patchy opacity in the right upper lobe new since prior exam. The lungs are hyperinflated. The visualized skeletal structures are stable. Focal sclerosis is identified the proximal right humerus unchanged.  IMPRESSION: Mild patchy opacity in the right upper lobe which could be due to pneumonia. Followup after treatment is recommended to ensure resolution. Emphysema.  Stable nodule in left upper lobe unchanged compared to prior exam 2010.   Electronically Signed   By: Sherian Rein M.D.   On: 05/25/2013 20:20   Dg Tibia/fibula Right  05/25/2013   CLINICAL DATA:  Pain in the right leg.  EXAM: RIGHT TIBIA AND FIBULA - 2 VIEW  COMPARISON:  No priors.  FINDINGS: Status post right below the knee amputation. The proximal 3rd of the right tibia and fibula remain. There is some mild irregularity of the tibial  stump, without frank osteolysis. Soft tissues appear unremarkable. Numerous surgical clips are noted along the medial aspect of the right leg and thigh. Bones appear osteopenic. Acute displaced fracture.  IMPRESSION: 1. Status post right below-the-knee amputation. No definite acute findings. 2. Osteopenia.   Electronically Signed   By: Trudie Reed M.D.   On: 05/25/2013 20:19     EKG Interpretation   None       MDM  Iv ns. Labs. Cxr/xr.  Reviewed nursing notes and prior charts for additional history.   Dilaudid 1 mg iv.   Recheck pt breathing comfortably. Nad.   No sign of infection in/around right bka stump, and in fact pt notes persistent pain in area since surgery.  Recheck bp 100/70.  On recheck prior ed visit, prior bp then in low 100.   Pt appears comfortable. Nad. As infiltrate on cxr and inc cough, congestion on exam, will rx rocephin and zithromax in ed.  rx for home. Close pcp f/u.  Pt appear stable for d/c.      Suzi Roots,  MD 05/25/13 2242

## 2013-05-25 NOTE — ED Notes (Signed)
Pt having pain and trouble in right knee to end of amputation that's been going on for past couple of days. Pt was told by PCP Paruchuri today by phone that she needed to come to ED due to abnormal blood work/xray that was done for antibiotic treatment.

## 2013-07-05 ENCOUNTER — Emergency Department (HOSPITAL_COMMUNITY): Payer: Managed Care, Other (non HMO)

## 2013-07-05 ENCOUNTER — Encounter (HOSPITAL_COMMUNITY): Payer: Self-pay | Admitting: Emergency Medicine

## 2013-07-05 ENCOUNTER — Inpatient Hospital Stay (HOSPITAL_COMMUNITY)
Admission: EM | Admit: 2013-07-05 | Discharge: 2013-07-09 | DRG: 193 | Disposition: A | Discharge status: Home / Self Care | Payer: Managed Care, Other (non HMO) | Attending: Internal Medicine | Admitting: Internal Medicine

## 2013-07-05 DIAGNOSIS — Z79899 Other long term (current) drug therapy: Secondary | ICD-10-CM

## 2013-07-05 DIAGNOSIS — F17200 Nicotine dependence, unspecified, uncomplicated: Secondary | ICD-10-CM | POA: Diagnosis present

## 2013-07-05 DIAGNOSIS — A419 Sepsis, unspecified organism: Secondary | ICD-10-CM | POA: Diagnosis present

## 2013-07-05 DIAGNOSIS — Z86718 Personal history of other venous thrombosis and embolism: Secondary | ICD-10-CM

## 2013-07-05 DIAGNOSIS — G894 Chronic pain syndrome: Secondary | ICD-10-CM | POA: Diagnosis present

## 2013-07-05 DIAGNOSIS — E2740 Unspecified adrenocortical insufficiency: Secondary | ICD-10-CM

## 2013-07-05 DIAGNOSIS — D649 Anemia, unspecified: Secondary | ICD-10-CM

## 2013-07-05 DIAGNOSIS — D6859 Other primary thrombophilia: Secondary | ICD-10-CM | POA: Diagnosis present

## 2013-07-05 DIAGNOSIS — R6520 Severe sepsis without septic shock: Secondary | ICD-10-CM

## 2013-07-05 DIAGNOSIS — E2749 Other adrenocortical insufficiency: Secondary | ICD-10-CM | POA: Diagnosis present

## 2013-07-05 DIAGNOSIS — Z7901 Long term (current) use of anticoagulants: Secondary | ICD-10-CM

## 2013-07-05 DIAGNOSIS — J441 Chronic obstructive pulmonary disease with (acute) exacerbation: Secondary | ICD-10-CM | POA: Diagnosis present

## 2013-07-05 DIAGNOSIS — D509 Iron deficiency anemia, unspecified: Secondary | ICD-10-CM | POA: Diagnosis present

## 2013-07-05 DIAGNOSIS — S88119A Complete traumatic amputation at level between knee and ankle, unspecified lower leg, initial encounter: Secondary | ICD-10-CM

## 2013-07-05 DIAGNOSIS — D518 Other vitamin B12 deficiency anemias: Secondary | ICD-10-CM | POA: Diagnosis present

## 2013-07-05 DIAGNOSIS — Z9884 Bariatric surgery status: Secondary | ICD-10-CM

## 2013-07-05 DIAGNOSIS — J189 Pneumonia, unspecified organism: Principal | ICD-10-CM | POA: Diagnosis present

## 2013-07-05 DIAGNOSIS — Z993 Dependence on wheelchair: Secondary | ICD-10-CM

## 2013-07-05 DIAGNOSIS — R509 Fever, unspecified: Secondary | ICD-10-CM

## 2013-07-05 DIAGNOSIS — J9600 Acute respiratory failure, unspecified whether with hypoxia or hypercapnia: Secondary | ICD-10-CM | POA: Diagnosis present

## 2013-07-05 DIAGNOSIS — G5632 Lesion of radial nerve, left upper limb: Secondary | ICD-10-CM

## 2013-07-05 DIAGNOSIS — J9601 Acute respiratory failure with hypoxia: Secondary | ICD-10-CM

## 2013-07-05 HISTORY — DX: Long term (current) use of anticoagulants: Z79.01

## 2013-07-05 LAB — CBC WITH DIFFERENTIAL/PLATELET
BASOS ABS: 0.1 10*3/uL (ref 0.0–0.1)
BASOS PCT: 1 % (ref 0–1)
Eosinophils Absolute: 0.1 10*3/uL (ref 0.0–0.7)
Eosinophils Relative: 1 % (ref 0–5)
HEMATOCRIT: 32.7 % — AB (ref 36.0–46.0)
Hemoglobin: 9.9 g/dL — ABNORMAL LOW (ref 12.0–15.0)
Lymphocytes Relative: 16 % (ref 12–46)
Lymphs Abs: 2 10*3/uL (ref 0.7–4.0)
MCH: 22.9 pg — ABNORMAL LOW (ref 26.0–34.0)
MCHC: 30.3 g/dL (ref 30.0–36.0)
MCV: 75.5 fL — ABNORMAL LOW (ref 78.0–100.0)
Monocytes Absolute: 0.9 10*3/uL (ref 0.1–1.0)
Monocytes Relative: 7 % (ref 3–12)
NEUTROS ABS: 9.7 10*3/uL — AB (ref 1.7–7.7)
Neutrophils Relative %: 76 % (ref 43–77)
PLATELETS: 420 10*3/uL — AB (ref 150–400)
RBC: 4.33 MIL/uL (ref 3.87–5.11)
RDW: 18.6 % — AB (ref 11.5–15.5)
WBC: 12.7 10*3/uL — AB (ref 4.0–10.5)

## 2013-07-05 LAB — BASIC METABOLIC PANEL
BUN: 6 mg/dL (ref 6–23)
CO2: 25 meq/L (ref 19–32)
Calcium: 9.1 mg/dL (ref 8.4–10.5)
Chloride: 95 mEq/L — ABNORMAL LOW (ref 96–112)
Creatinine, Ser: 0.5 mg/dL (ref 0.50–1.10)
GFR calc non Af Amer: >90 mL/min (ref >90)
Glucose, Bld: 99 mg/dL (ref 70–99)
POTASSIUM: 4.2 meq/L (ref 3.7–5.3)
Sodium: 133 mEq/L — ABNORMAL LOW (ref 137–147)

## 2013-07-05 MED ORDER — ALBUTEROL SULFATE (2.5 MG/3ML) 0.083% IN NEBU
2.5000 mg | INHALATION_SOLUTION | Freq: Once | RESPIRATORY_TRACT | Status: AC
  Administered 2013-07-05: 2.5 mg via RESPIRATORY_TRACT
  Filled 2013-07-05: qty 3

## 2013-07-05 MED ORDER — ALBUTEROL SULFATE (2.5 MG/3ML) 0.083% IN NEBU: 5.0000 mg | INHALATION_SOLUTION | Freq: Once | RESPIRATORY_TRACT | Status: DC

## 2013-07-05 MED ORDER — IPRATROPIUM BROMIDE 0.02 % IN SOLN: 0.5000 mg | Freq: Once | RESPIRATORY_TRACT | Status: DC

## 2013-07-05 MED ORDER — IPRATROPIUM-ALBUTEROL 0.5-2.5 (3) MG/3ML IN SOLN
3.0000 mL | Freq: Once | RESPIRATORY_TRACT | Status: AC
  Administered 2013-07-05: 3 mL via RESPIRATORY_TRACT
  Filled 2013-07-05: qty 3

## 2013-07-05 NOTE — ED Notes (Signed)
Sob, cough, pain to lt rib area when taking a deep breath, copd pt and states that she has had a hx of pneumonia.

## 2013-07-06 ENCOUNTER — Encounter (HOSPITAL_COMMUNITY): Payer: Self-pay | Admitting: Internal Medicine

## 2013-07-06 ENCOUNTER — Inpatient Hospital Stay (HOSPITAL_COMMUNITY): Payer: Managed Care, Other (non HMO)

## 2013-07-06 DIAGNOSIS — J189 Pneumonia, unspecified organism: Principal | ICD-10-CM

## 2013-07-06 DIAGNOSIS — R509 Fever, unspecified: Secondary | ICD-10-CM | POA: Diagnosis present

## 2013-07-06 DIAGNOSIS — D509 Iron deficiency anemia, unspecified: Secondary | ICD-10-CM | POA: Diagnosis present

## 2013-07-06 DIAGNOSIS — Z86718 Personal history of other venous thrombosis and embolism: Secondary | ICD-10-CM

## 2013-07-06 DIAGNOSIS — D649 Anemia, unspecified: Secondary | ICD-10-CM

## 2013-07-06 DIAGNOSIS — J441 Chronic obstructive pulmonary disease with (acute) exacerbation: Secondary | ICD-10-CM | POA: Diagnosis present

## 2013-07-06 LAB — MRSA PCR SCREENING: MRSA BY PCR: NEGATIVE

## 2013-07-06 LAB — COMPREHENSIVE METABOLIC PANEL
ALT: 5 U/L (ref 0–35)
AST: 10 U/L (ref 0–37)
Albumin: 2.6 g/dL — ABNORMAL LOW (ref 3.5–5.2)
Alkaline Phosphatase: 84 U/L (ref 39–117)
BUN: 5 mg/dL — AB (ref 6–23)
CALCIUM: 8.3 mg/dL — AB (ref 8.4–10.5)
CHLORIDE: 101 meq/L (ref 96–112)
CO2: 25 mEq/L (ref 19–32)
CREATININE: 0.41 mg/dL — AB (ref 0.50–1.10)
GFR calc Af Amer: >90 mL/min (ref >90)
GFR calc non Af Amer: >90 mL/min (ref >90)
Glucose, Bld: 106 mg/dL — ABNORMAL HIGH (ref 70–99)
Potassium: 3.5 mEq/L — ABNORMAL LOW (ref 3.7–5.3)
Sodium: 138 mEq/L (ref 137–147)
TOTAL PROTEIN: 6.1 g/dL (ref 6.0–8.3)
Total Bilirubin: 0.3 mg/dL (ref 0.3–1.2)

## 2013-07-06 LAB — IRON AND TIBC: UIBC: 249 ug/dL (ref 125–400)

## 2013-07-06 LAB — RESPIRATORY VIRUS PANEL
Adenovirus: NOT DETECTED
INFLUENZA A H1: NOT DETECTED
INFLUENZA A H3: NOT DETECTED
INFLUENZA B 1: NOT DETECTED
Influenza A: NOT DETECTED
METAPNEUMOVIRUS: NOT DETECTED
PARAINFLUENZA 3 A: NOT DETECTED
Parainfluenza 1: NOT DETECTED
Parainfluenza 2: NOT DETECTED
RHINOVIRUS: NOT DETECTED
Respiratory Syncytial Virus A: NOT DETECTED
Respiratory Syncytial Virus B: NOT DETECTED

## 2013-07-06 LAB — CBC
HEMATOCRIT: 26.9 % — AB (ref 36.0–46.0)
HEMOGLOBIN: 8.2 g/dL — AB (ref 12.0–15.0)
MCH: 23.3 pg — ABNORMAL LOW (ref 26.0–34.0)
MCHC: 30.5 g/dL (ref 30.0–36.0)
MCV: 76.4 fL — ABNORMAL LOW (ref 78.0–100.0)
Platelets: 296 10*3/uL (ref 150–400)
RBC: 3.52 MIL/uL — ABNORMAL LOW (ref 3.87–5.11)
RDW: 18.9 % — ABNORMAL HIGH (ref 11.5–15.5)
WBC: 8.2 10*3/uL (ref 4.0–10.5)

## 2013-07-06 LAB — BLOOD GAS, ARTERIAL
Acid-Base Excess: 1.9 mmol/L (ref 0.0–2.0)
BICARBONATE: 26.3 meq/L — AB (ref 20.0–24.0)
Drawn by: 235321
O2 Content: 3 L/min
O2 Saturation: 95.4 %
PATIENT TEMPERATURE: 98.6
PCO2 ART: 43.2 mmHg (ref 35.0–45.0)
TCO2: 24.8 mmol/L (ref 0–100)
pH, Arterial: 7.402 (ref 7.350–7.450)
pO2, Arterial: 81.1 mmHg (ref 80.0–100.0)

## 2013-07-06 LAB — TROPONIN I
Troponin I: <0.3 ng/mL (ref <0.30)
Troponin I: <0.3 ng/mL (ref <0.30)

## 2013-07-06 LAB — URINALYSIS, ROUTINE W REFLEX MICROSCOPIC
Bilirubin Urine: NEGATIVE
Glucose, UA: NEGATIVE mg/dL
Hgb urine dipstick: NEGATIVE
Ketones, ur: NEGATIVE mg/dL
Leukocytes, UA: NEGATIVE
NITRITE: NEGATIVE
PH: 6 (ref 5.0–8.0)
Protein, ur: NEGATIVE mg/dL
SPECIFIC GRAVITY, URINE: 1.031 — AB (ref 1.005–1.030)
Urobilinogen, UA: 1 mg/dL (ref 0.0–1.0)

## 2013-07-06 LAB — RETICULOCYTES
RBC.: 3.68 MIL/uL — ABNORMAL LOW (ref 3.87–5.11)
Retic Count, Absolute: 55.2 10*3/uL (ref 19.0–186.0)
Retic Ct Pct: 1.5 % (ref 0.4–3.1)

## 2013-07-06 LAB — PROCALCITONIN: Procalcitonin: <0.1 ng/mL

## 2013-07-06 LAB — VITAMIN B12: Vitamin B-12: 266 pg/mL (ref 211–911)

## 2013-07-06 LAB — FOLATE: Folate: 12.5 ng/mL

## 2013-07-06 LAB — INFLUENZA PANEL BY PCR (TYPE A & B)
H1N1FLUPCR: NOT DETECTED
Influenza A By PCR: NEGATIVE
Influenza B By PCR: NEGATIVE

## 2013-07-06 LAB — PROTIME-INR
INR: 2.57 — ABNORMAL HIGH (ref 0.00–1.49)
Prothrombin Time: 26.7 seconds — ABNORMAL HIGH (ref 11.6–15.2)

## 2013-07-06 LAB — LACTIC ACID, PLASMA: Lactic Acid, Venous: 1.4 mmol/L (ref 0.5–2.2)

## 2013-07-06 LAB — FERRITIN: Ferritin: 27 ng/mL (ref 10–291)

## 2013-07-06 LAB — CORTISOL: CORTISOL PLASMA: 9.6 ug/dL

## 2013-07-06 LAB — STREP PNEUMONIAE URINARY ANTIGEN: Strep Pneumo Urinary Antigen: NEGATIVE

## 2013-07-06 MED ORDER — DEXTROSE 5 % IV SOLN
1.0000 g | Freq: Three times a day (TID) | INTRAVENOUS | Status: DC
  Administered 2013-07-06 – 2013-07-07 (×4): 1 g via INTRAVENOUS
  Filled 2013-07-06 (×5): qty 1

## 2013-07-06 MED ORDER — BIOTENE DRY MOUTH MT LIQD
15.0000 mL | Freq: Two times a day (BID) | OROMUCOSAL | Status: DC
  Administered 2013-07-06 – 2013-07-09 (×7): 15 mL via OROMUCOSAL

## 2013-07-06 MED ORDER — IPRATROPIUM-ALBUTEROL 0.5-2.5 (3) MG/3ML IN SOLN: 3.0000 mL | RESPIRATORY_TRACT | Status: DC | PRN

## 2013-07-06 MED ORDER — DEXTROSE 5 % IV SOLN
1.0000 g | Freq: Once | INTRAVENOUS | Status: DC
  Filled 2013-07-06: qty 10

## 2013-07-06 MED ORDER — BOOST / RESOURCE BREEZE PO LIQD
1.0000 | Freq: Two times a day (BID) | ORAL | Status: DC
  Administered 2013-07-06 – 2013-07-07 (×2): 1 via ORAL

## 2013-07-06 MED ORDER — IPRATROPIUM BROMIDE 0.02 % IN SOLN: 0.5000 mg | Freq: Four times a day (QID) | RESPIRATORY_TRACT | Status: DC

## 2013-07-06 MED ORDER — DEXTROSE 5 % IV SOLN
500.0000 mg | Freq: Once | INTRAVENOUS | Status: AC
  Administered 2013-07-06: 500 mg via INTRAVENOUS

## 2013-07-06 MED ORDER — GUAIFENESIN-DM 100-10 MG/5ML PO SYRP: 5.0000 mL | ORAL_SOLUTION | ORAL | Status: DC | PRN

## 2013-07-06 MED ORDER — ALBUTEROL (5 MG/ML) CONTINUOUS INHALATION SOLN
10.0000 mg/h | INHALATION_SOLUTION | RESPIRATORY_TRACT | Status: AC
  Administered 2013-07-06: 10 mg/h via RESPIRATORY_TRACT

## 2013-07-06 MED ORDER — SODIUM CHLORIDE 0.9 % IV BOLUS (SEPSIS)
1000.0000 mL | Freq: Once | INTRAVENOUS | Status: AC
  Administered 2013-07-06: 1000 mL via INTRAVENOUS

## 2013-07-06 MED ORDER — METHYLPREDNISOLONE SODIUM SUCC 125 MG IJ SOLR
60.0000 mg | Freq: Four times a day (QID) | INTRAMUSCULAR | Status: DC
  Administered 2013-07-06 – 2013-07-07 (×3): 60 mg via INTRAVENOUS
  Filled 2013-07-06 (×5): qty 0.96
  Filled 2013-07-06: qty 2
  Filled 2013-07-06 (×2): qty 0.96

## 2013-07-06 MED ORDER — GUAIFENESIN ER 600 MG PO TB12
600.0000 mg | ORAL_TABLET | Freq: Two times a day (BID) | ORAL | Status: DC | PRN
  Filled 2013-07-06: qty 1

## 2013-07-06 MED ORDER — OSELTAMIVIR PHOSPHATE 75 MG PO CAPS
75.0000 mg | ORAL_CAPSULE | Freq: Two times a day (BID) | ORAL | Status: DC
Start: 2013-07-06 — End: 2013-07-07
  Administered 2013-07-06 (×3): 75 mg via ORAL
  Filled 2013-07-06 (×6): qty 1

## 2013-07-06 MED ORDER — DULOXETINE HCL 60 MG PO CPEP
60.0000 mg | ORAL_CAPSULE | Freq: Every day | ORAL | Status: DC
  Administered 2013-07-06 – 2013-07-09 (×4): 60 mg via ORAL
  Filled 2013-07-06 (×4): qty 1

## 2013-07-06 MED ORDER — SODIUM CHLORIDE 0.9 % IV SOLN
INTRAVENOUS | Status: DC
  Administered 2013-07-06 – 2013-07-07 (×3): via INTRAVENOUS
  Administered 2013-07-08: 12:00:00 10 mL via INTRAVENOUS

## 2013-07-06 MED ORDER — WARFARIN - PHARMACIST DOSING INPATIENT: Freq: Every day | Status: DC

## 2013-07-06 MED ORDER — MOMETASONE FURO-FORMOTEROL FUM 200-5 MCG/ACT IN AERO
2.0000 | INHALATION_SPRAY | Freq: Two times a day (BID) | RESPIRATORY_TRACT | Status: DC
  Administered 2013-07-06 – 2013-07-09 (×7): 2 via RESPIRATORY_TRACT
  Filled 2013-07-06: qty 8.8

## 2013-07-06 MED ORDER — OXYCODONE HCL 5 MG PO TABS
15.0000 mg | ORAL_TABLET | Freq: Four times a day (QID) | ORAL | Status: DC | PRN
  Administered 2013-07-07 – 2013-07-09 (×7): 15 mg via ORAL
  Filled 2013-07-06 (×7): qty 3

## 2013-07-06 MED ORDER — BENZONATATE 100 MG PO CAPS
100.0000 mg | ORAL_CAPSULE | Freq: Three times a day (TID) | ORAL | Status: DC
  Administered 2013-07-06 – 2013-07-08 (×7): 100 mg via ORAL
  Filled 2013-07-06 (×10): qty 1

## 2013-07-06 MED ORDER — HYDROCORTISONE SOD SUCCINATE 100 MG IJ SOLR: 100.0000 mg | Freq: Once | INTRAMUSCULAR | Status: DC

## 2013-07-06 MED ORDER — IOHEXOL 350 MG/ML SOLN
100.0000 mL | Freq: Once | INTRAVENOUS | Status: AC | PRN
  Administered 2013-07-06: 100 mL via INTRAVENOUS

## 2013-07-06 MED ORDER — SODIUM CHLORIDE 0.9 % IV BOLUS (SEPSIS)
500.0000 mL | Freq: Once | INTRAVENOUS | Status: AC
  Administered 2013-07-06: 500 mL via INTRAVENOUS

## 2013-07-06 MED ORDER — GABAPENTIN 400 MG PO CAPS
1200.0000 mg | ORAL_CAPSULE | Freq: Every day | ORAL | Status: DC
  Administered 2013-07-06 – 2013-07-08 (×3): 1200 mg via ORAL
  Filled 2013-07-06 (×4): qty 3

## 2013-07-06 MED ORDER — DEXTROSE 5 % IV SOLN
1.0000 g | INTRAVENOUS | Status: DC
  Administered 2013-07-06: 1 g via INTRAVENOUS
  Filled 2013-07-06: qty 10

## 2013-07-06 MED ORDER — WHITE PETROLATUM GEL
Status: DC | PRN
  Filled 2013-07-06: qty 5

## 2013-07-06 MED ORDER — ALBUTEROL SULFATE (2.5 MG/3ML) 0.083% IN NEBU
2.5000 mg | INHALATION_SOLUTION | RESPIRATORY_TRACT | Status: DC | PRN
  Administered 2013-07-07: 20:00:00 2.5 mg via RESPIRATORY_TRACT

## 2013-07-06 MED ORDER — HEPARIN (PORCINE) IN NACL 100-0.45 UNIT/ML-% IJ SOLN
1000.0000 [IU]/h | INTRAMUSCULAR | Status: DC
  Administered 2013-07-06: 1000 [IU]/h via INTRAVENOUS
  Filled 2013-07-06: qty 250

## 2013-07-06 MED ORDER — CAPSAICIN 0.025 % EX CREA
TOPICAL_CREAM | Freq: Two times a day (BID) | CUTANEOUS | Status: DC
  Administered 2013-07-06: 22:00:00 via TOPICAL
  Filled 2013-07-06: qty 56.6

## 2013-07-06 MED ORDER — VANCOMYCIN HCL IN DEXTROSE 750-5 MG/150ML-% IV SOLN
750.0000 mg | Freq: Three times a day (TID) | INTRAVENOUS | Status: DC
Start: 2013-07-06 — End: 2013-07-07
  Administered 2013-07-06 – 2013-07-07 (×4): 750 mg via INTRAVENOUS
  Filled 2013-07-06 (×6): qty 150

## 2013-07-06 MED ORDER — SODIUM CHLORIDE 0.9 % IV BOLUS (SEPSIS)
1000.0000 mL | Freq: Once | INTRAVENOUS | Status: AC
Start: 2013-07-06 — End: 2013-07-06
  Administered 2013-07-06: 1000 mL via INTRAVENOUS

## 2013-07-06 MED ORDER — ALBUTEROL SULFATE (2.5 MG/3ML) 0.083% IN NEBU: 2.5000 mg | INHALATION_SOLUTION | RESPIRATORY_TRACT | Status: DC | PRN

## 2013-07-06 MED ORDER — GABAPENTIN 300 MG PO CAPS
600.0000 mg | ORAL_CAPSULE | ORAL | Status: DC
  Administered 2013-07-06 – 2013-07-09 (×8): 600 mg via ORAL
  Filled 2013-07-06 (×9): qty 2

## 2013-07-06 MED ORDER — IPRATROPIUM-ALBUTEROL 0.5-2.5 (3) MG/3ML IN SOLN
3.0000 mL | Freq: Four times a day (QID) | RESPIRATORY_TRACT | Status: DC
  Administered 2013-07-06 – 2013-07-09 (×12): 3 mL via RESPIRATORY_TRACT
  Filled 2013-07-06 (×13): qty 3

## 2013-07-06 MED ORDER — DEXTROSE 5 % IV SOLN
2.0000 g | Freq: Two times a day (BID) | INTRAVENOUS | Status: DC
  Filled 2013-07-06 (×2): qty 2

## 2013-07-06 MED ORDER — WARFARIN SODIUM 2.5 MG PO TABS
2.5000 mg | ORAL_TABLET | Freq: Once | ORAL | Status: AC
  Administered 2013-07-06: 2.5 mg via ORAL
  Filled 2013-07-06: qty 1

## 2013-07-06 MED ORDER — WARFARIN SODIUM 5 MG PO TABS
5.0000 mg | ORAL_TABLET | Freq: Once | ORAL | Status: AC
Start: 2013-07-06 — End: 2013-07-06
  Administered 2013-07-06: 5 mg via ORAL
  Filled 2013-07-06: qty 1

## 2013-07-06 MED ORDER — SODIUM CHLORIDE 0.9 % IV BOLUS (SEPSIS)
1000.0000 mL | INTRAVENOUS | Status: DC | PRN
  Administered 2013-07-06 (×6): 1000 mL via INTRAVENOUS

## 2013-07-06 MED ORDER — FENTANYL 50 MCG/HR TD PT72
50.0000 ug | MEDICATED_PATCH | TRANSDERMAL | Status: DC
  Administered 2013-07-07: 50 ug via TRANSDERMAL
  Filled 2013-07-06: qty 1

## 2013-07-06 MED ORDER — DEXTROSE 5 % IV SOLN
500.0000 mg | INTRAVENOUS | Status: AC
  Administered 2013-07-06 – 2013-07-08 (×3): 500 mg via INTRAVENOUS
  Filled 2013-07-06 (×4): qty 500

## 2013-07-06 MED ORDER — HYDROCORTISONE SOD SUCCINATE 100 MG IJ SOLR: 50.0000 mg | Freq: Four times a day (QID) | INTRAMUSCULAR | Status: DC

## 2013-07-06 NOTE — Progress Notes (Signed)
ANTIBIOTIC CONSULT NOTE - INITIAL  Pharmacy Consult for Vancomycin Indication: PNA  No Known Allergies  Patient Measurements: Height: 5\' 8"  (172.7 cm) Weight: 138 lb 10.7 oz (62.9 kg) IBW/kg (Calculated) : 63.9   Vital Signs: Temp: 98.1 F (36.7 C) (01/02 0457) Temp src: Oral (01/02 0457) BP: 91/58 mmHg (01/02 0457) Pulse Rate: 94 (01/02 0457) Intake/Output from previous day:   Intake/Output from this shift:    Labs:  Recent Labs  07/05/13 1900  WBC 12.7*  HGB 9.9*  PLT 420*  CREATININE 0.50   Estimated Creatinine Clearance: 82.6 ml/min (by C-G formula based on Cr of 0.5). No results found for this basename: VANCOTROUGH, VANCOPEAK, VANCORANDOM, GENTTROUGH, GENTPEAK, GENTRANDOM, TOBRATROUGH, TOBRAPEAK, TOBRARND, AMIKACINPEAK, AMIKACINTROU, AMIKACIN,  in the last 72 hours   Microbiology: No results found for this or any previous visit (from the past 720 hour(s)).  Medical History: Past Medical History  Diagnosis Date  . DVT (deep venous thrombosis)   . COPD (chronic obstructive pulmonary disease)   . Bronchitis   . 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 Lupus anticoagulant disorder   . Osteoporosis   . Chronic pain syndrome     Phantom leg pain, right  . History of pleural effusion     Status post sclerotherapy  . Chronic anticoagulation     Medications:  Scheduled:  . antiseptic oral rinse  15 mL Mouth Rinse BID  . azithromycin  500 mg Intravenous Q24H  . cefTRIAXone (ROCEPHIN)  IV  1 g Intravenous Q24H  . DULoxetine  60 mg Oral Daily  . [START ON 07/07/2013] fentaNYL  50 mcg Transdermal Q72H  . gabapentin  1,200 mg Oral QHS  . gabapentin  600 mg Oral Custom  . ipratropium-albuterol  3 mL Nebulization Q6H  . mometasone-formoterol  2 puff Inhalation BID  . oseltamivir  75 mg Oral BID  . sodium chloride  500 mL Intravenous Once  . vancomycin  750 mg Intravenous Q8H   Infusions:  . sodium chloride 125 mL/hr at 07/06/13 0515   Assessment: 52  with recent PNA diagnosis and on chronic warfarin for hx of DVT/PE admitted with SOB and stabbing pain to chest and back. Rocephin/Zmax per MD and Vancomycin per RX.  Goal of Therapy:  Vancomycin trough level 15-20 mcg/ml  Plan:   Vancomycin 750mg  IV q8h  F/U SCr/levels as needed  Lorenza EvangelistGreen, Ambrielle Kington R 07/06/2013,6:04 AM

## 2013-07-06 NOTE — Progress Notes (Signed)
[facility] PROGRESS NOTE  Vicie MuttersDarlene T Trueheart XBJ:[id number] DOB: 06/19/1961 DOA: 07/05/2013 PCP: Charlette CaffeyParuchuri, Lakshmi P, MD  Assessment/Plan   Persistent hypotension despite fluid resuscitation -  Cortisol level:  9, likely below excepted given degree of illness (COPD exac, PNA, possible sepsis, hypotension) -  Will start stress dose steroids -  Escalate abx to vanc, cefepime, azithro for now pending cultures -  Repeat CBC with hgb 8.2 likely from hemodilution.  Monitor for bleeding -  Repeat troponin also neg  COPD exac -  Continue BD -  Add solumedrol -  Continue abx, see below  CAP -  Escalate to vanc, cefepime, azithro due to severity of illness -  Pro-calcitonin is negative   Febrile illness - influenza PCR neg -  F/u resp viral panel    Microcytic anemia -  F/u anemia panel   Chronic pain and high dose narcotics -  Continue fentanyl patch -  Hold pain medications which could contribute to hypotension for now  Diet:  regular Access:  PIV IVF:  yes Proph:  lovenox  Code Status: full Family Communication: patient alone Disposition Plan:  Pending improvement in BP and breathing   Consultants:  Discussed with PCCM  Procedures:  CT angio chest   Antibiotics:  vanc 1/2 >>  Cefepime 1/2 >>  azithro 1/2 >>   HPI/Subjective:  States she feels tired and she has bad cough.  Mild LH and severe back pain.     Objective: Filed Vitals:   07/06/13 1815 07/06/13 1830 07/06/13 1845 07/06/13 1900  BP: 90/55 87/52 84/46  82/45  Pulse: 83 76 74 72  Temp:      TempSrc:      Resp: 30 19 17 15   Height:      Weight:      SpO2: 97% 97% 97% 97%    Intake/Output Summary (Last 24 hours) at 07/06/13 1939 Last data filed at 07/06/13 1800  Gross per 24 hour  Intake   6900 ml  Output   2700 ml  Net   4200 ml   Filed Weights   07/06/13 0359 07/06/13 0457  Weight: 67.6 kg (149 lb 0.5 oz) 62.9 kg (138 lb 10.7 oz)    Exam:   General:  Thin CF, No acute  distress  HEENT:  NCAT, dry MM, sunken eyes  Cardiovascular:  RRR, nl S1, S2 no mrg, 2+ pulses, warm extremities  Respiratory:  Diminished BS right side/bronchial, with wheezing on the right, + rhonchi, no increased WOB  Abdomen:   NABS, soft, NT/ND  MSK:   Normal tone and bulk, no LEE  Neuro:  Grossly intact  Data Reviewed: Basic Metabolic Panel:  Recent Labs Lab 07/05/13 1900 07/06/13 0622  NA 133* 138  K 4.2 3.5*  CL 95* 101  CO2 25 25  GLUCOSE 99 106*  BUN 6 5*  CREATININE 0.50 0.41*  CALCIUM 9.1 8.3*   Liver Function Tests:  Recent Labs Lab 07/06/13 0622  AST 10  ALT 5  ALKPHOS 84  BILITOT 0.3  PROT 6.1  ALBUMIN 2.6*   No results found for this basename: LIPASE, AMYLASE,  in the last 168 hours No results found for this basename: AMMONIA,  in the last 168 hours CBC:  Recent Labs Lab 07/05/13 1900 07/06/13 1235  WBC 12.7* 8.2  NEUTROABS 9.7*  --   HGB 9.9* 8.2*  HCT 32.7* 26.9*  MCV 75.5* 76.4*  PLT 420* 296   Cardiac Enzymes:  Recent Labs Lab 07/06/13 0255  07/06/13 0920 07/06/13 1235  TROPONINI <0.30 <0.30 <0.30   BNP (last 3 results) No results found for this basename: PROBNP,  in the last 8760 hours CBG: No results found for this basename: GLUCAP,  in the last 168 hours  Recent Results (from the past 240 hour(s))  MRSA PCR SCREENING     Status: None   Collection Time    07/06/13  6:58 AM      Result Value Range Status   MRSA by PCR NEGATIVE  NEGATIVE Final   Comment:            The GeneXpert MRSA Assay (FDA     approved for NASAL specimens     only), is one component of a     comprehensive MRSA colonization     surveillance program. It is not     intended to diagnose MRSA     infection nor to guide or     monitor treatment for     MRSA infections.     Studies: Dg Chest 2 View  07/05/2013   CLINICAL DATA:  Cough and shortness of Breath.  EXAM: CHEST  2 VIEW  COMPARISON:  05/25/2013  FINDINGS: The cardiac silhouette,  mediastinal and hilar contours are normal and stable. The lungs demonstrate stable hyperinflation and chronic bronchitic changes. There is a patchy right middle lobe infiltrate. The left lung is clear. The bony thorax is intact. Stable enchondroma in the humeral metaphysis  IMPRESSION: Right middle lobe infiltrate.   Electronically Signed   By: Loralie Champagne M.D.   On: 07/05/2013 18:51   Ct Angio Chest Pe W/cm &/or Wo Cm  07/06/2013   CLINICAL DATA:  Shortness of breath. Productive cough. Chest pain. White cell count 12.7.  EXAM: CT ANGIOGRAPHY CHEST WITH CONTRAST  TECHNIQUE: Multidetector CT imaging of the chest was performed using the standard protocol during bolus administration of intravenous contrast. Multiplanar CT image reconstructions including MIPs were obtained to evaluate the vascular anatomy.  CONTRAST:  OMNIPAQUE IOHEXOL 350 MG/ML SOLN  COMPARISON:  01/05/2010  FINDINGS: Technically adequate study with good opacification of the central and segmental pulmonary arteries. No focal filling defects demonstrated. No evidence of significant pulmonary embolus.  Normal heart size. Normal caliber thoracic aorta. No dissection. Prominent mediastinal and right hilar lymph nodes are demonstrated, measuring up to about 1.8 cm Scout Gumbs axis dimension. These are demonstrating some progression since previous study. These are likely to be reactive although followup is recommended to exclude lymphoproliferative process. Esophagus is decompressed.  There is diffuse patchy nodular infiltration in the lungs with tree-in-bud pattern consistent with bronchiolitis. Focal consolidation seen in the right middle lung. No pneumothorax. Diffuse airways thickening. Airways are patent. No pleural effusions. Probable postoperative changes in the upper stomach.  Review of the MIP images confirms the above findings.  IMPRESSION: No evidence of significant pulmonary embolus. Diffuse bilateral tree-in-bud parenchymal pattern to the  lungs suggesting bronchiolitis with focal consolidation in the right middle lung. Right hilar and mediastinal lymph nodes are enlarged, likely reactive given the inflammatory process. Follow-up recommended to exclude underlying neoplastic change.   Electronically Signed   By: Burman Nieves M.D.   On: 07/06/2013 04:39    Scheduled Meds: . antiseptic oral rinse  15 mL Mouth Rinse BID  . azithromycin  500 mg Intravenous Q24H  . benzonatate  100 mg Oral TID  . capsaicin   Topical BID  . ceFEPime (MAXIPIME) IV  1 g Intravenous Q8H  . DULoxetine  60 mg Oral Daily  . feeding supplement (RESOURCE BREEZE)  1 Container Oral BID BM  . [START ON 07/07/2013] fentaNYL  50 mcg Transdermal Q72H  . gabapentin  1,200 mg Oral QHS  . gabapentin  600 mg Oral Custom  . hydrocortisone sod succinate (SOLU-CORTEF) inj  100 mg Intravenous Once  . hydrocortisone sod succinate (SOLU-CORTEF) inj  50 mg Intravenous Q6H  . ipratropium-albuterol  3 mL Nebulization Q6H  . mometasone-formoterol  2 puff Inhalation BID  . oseltamivir  75 mg Oral BID  . vancomycin  750 mg Intravenous Q8H  . Warfarin - Pharmacist Dosing Inpatient   Does not apply q1800   Continuous Infusions: . sodium chloride 125 mL/hr at 07/06/13 1034    Active Problems:   COPD exacerbation   Anemia   Febrile illness   CAP (community acquired pneumonia)   History of DVT of lower extremity    Time spent: 30 min    Nivaan Dicenzo, [facility]  [facility] Pager [phone]. If 7PM-7AM, please contact night-coverage at [email or web address], password [facility] 07/06/2013, 7:39 PM  LOS: 1 day

## 2013-07-06 NOTE — Progress Notes (Signed)
ANTICOAGULATION CONSULT NOTE - Initial Consult  Pharmacy Consult for Warfarin Indication: Hx DVT/PE  No Known Allergies  Patient Measurements: Height: 5\' 8"  (172.7 cm) Weight: 138 lb 10.7 oz (62.9 kg) IBW/kg (Calculated) : 63.9   Vital Signs: Temp: 98.1 F (36.7 C) (01/02 0457) Temp src: Oral (01/02 0457) BP: 91/58 mmHg (01/02 0457) Pulse Rate: 94 (01/02 0457)  Labs:  Recent Labs  07/05/13 1900 07/06/13 0255  HGB 9.9*  --   HCT 32.7*  --   PLT 420*  --   LABPROT  --  26.7*  INR  --  2.57*  CREATININE 0.50  --   TROPONINI  --  <0.30    Estimated Creatinine Clearance: 82.6 ml/min (by C-G formula based on Cr of 0.5).   Medical History: Past Medical History  Diagnosis Date  . DVT (deep venous thrombosis)   . COPD (chronic obstructive pulmonary disease)   . Bronchitis   . 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 Lupus anticoagulant disorder   . Osteoporosis   . Chronic pain syndrome     Phantom leg pain, right  . History of pleural effusion     Status post sclerotherapy  . Chronic anticoagulation     Medications:  Scheduled:  . antiseptic oral rinse  15 mL Mouth Rinse BID  . azithromycin  500 mg Intravenous Q24H  . cefTRIAXone (ROCEPHIN)  IV  1 g Intravenous Q24H  . DULoxetine  60 mg Oral Daily  . [START ON 07/07/2013] fentaNYL  50 mcg Transdermal Q72H  . gabapentin  1,200 mg Oral QHS  . gabapentin  600 mg Oral Custom  . ipratropium-albuterol  3 mL Nebulization Q6H  . mometasone-formoterol  2 puff Inhalation BID  . oseltamivir  75 mg Oral BID  . vancomycin  750 mg Intravenous Q8H  . warfarin  5 mg Oral Once  . Warfarin - Pharmacist Dosing Inpatient   Does not apply q1800   Infusions:  . 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 Home dose = 5mg  daily @ hs.  Goal of Therapy:  INR 2-3    Plan:   Warfarin 5 mg x1 this am (~0800)  Daily  PT/INR  Education  Susanne GreenhouseGreen, Harol Shabazz R 07/06/2013,6:27 AM

## 2013-07-06 NOTE — Progress Notes (Signed)
Report called to ICU. Patient will be transported following CT.

## 2013-07-06 NOTE — Progress Notes (Signed)
ANTIBIOTIC CONSULT NOTE - INITIAL  Pharmacy Consult for Cefepime Indication: rule out sepsis  No Known Allergies  Patient Measurements: Height: 5\' 8"  (172.7 cm) Weight: 138 lb 10.7 oz (62.9 kg) IBW/kg (Calculated) : 63.9  Vital Signs: Temp: 97.8 F (36.6 C) (01/02 0800) Temp src: Axillary (01/02 0800) BP: 118/58 mmHg (01/02 1130) Pulse Rate: 101 (01/02 1130) Intake/Output from previous day: 01/01 0701 - 01/02 0700 In: 325 [I.V.:125; IV Piggyback:200] Out: 500 [Urine:500] Intake/Output from this shift: Total I/O In: 500 [I.V.:500] Out: 1200 [Urine:1200]  Labs:  Recent Labs  07/05/13 1900 07/06/13 0622  WBC 12.7*  --   HGB 9.9*  --   PLT 420*  --   CREATININE 0.50 0.41*   Estimated Creatinine Clearance: 82.6 ml/min (by C-G formula based on Cr of 0.41). No results found for this basename: VANCOTROUGH, Leodis BinetVANCOPEAK, VANCORANDOM, GENTTROUGH, GENTPEAK, GENTRANDOM, TOBRATROUGH, TOBRAPEAK, TOBRARND, AMIKACINPEAK, AMIKACINTROU, AMIKACIN,  in the last 72 hours   Microbiology: Recent Results (from the past 720 hour(s))  MRSA PCR SCREENING     Status: None   Collection Time    07/06/13  6:58 AM      Result Value Range Status   MRSA by PCR NEGATIVE  NEGATIVE Final   Comment:            The GeneXpert MRSA Assay (FDA     approved for NASAL specimens     only), is one component of a     comprehensive MRSA colonization     surveillance program. It is not     intended to diagnose MRSA     infection nor to guide or     monitor treatment for     MRSA infections.    Medical History: Past Medical History  Diagnosis Date  . DVT (deep venous thrombosis)   . COPD (chronic obstructive pulmonary disease)   . Bronchitis   . 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 Lupus anticoagulant disorder   . Osteoporosis   . Chronic pain syndrome     Phantom leg pain, right  . History of pleural effusion     Status post sclerotherapy  . Chronic anticoagulation       Assessment: [age] yoF with PMH DVT, lupus antiocoagulant disorder, COPD, bronchitis recently treated for PNA with IV antibiotics presents with SOB and sharp back pains.  CXR worrisome for PNA and ceftriaxone, azithromycin, and vancomycin started.  Now hypotensive and switching Ceftriaxone to Cefepime for pseudomonal coverage for HCAP and possible sepsis.  Tmax 99.8  WBC 12.7  SCr 0.41, CrCl~82 ml/min  Blood and urine cultures ordered.  Goal of Therapy:  Vancomycin trough level 15-20 mcg/ml Doses adjusted per renal clearance, Eradication of infection  Plan:  1.  Cefepime 1g IV q8h. 2.  F/u SCr, culture results, clinical course.  Clance BollRunyon, Wilhelmina Hark 07/06/2013,12:00 PM

## 2013-07-06 NOTE — Progress Notes (Signed)
Call placed to NP M. Lynch concerning pt  Low BP and 9 Liters of NS in 24 hrs. Pt not responding to fluid. Lethargic but oriented x 4. Lungs clear. Call back received, NP to bedside. PCCM called per NP, will watch BP for now. To contact NP if pt mental status changes or bp drops lower than it is currently. Pt changed to ICU status for closer monitoring of BP. Will continue to monitor per myself and bedside nurse Creta LevinMelinda K. RN.

## 2013-07-06 NOTE — ED Provider Notes (Signed)
CSN: [id number]     Arrival date & time 07/05/13  1755 History   First MD Initiated Contact with Patient 07/05/13 2213     Chief Complaint  Patient presents with  . Cough  . 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 She has pain to her ribs when she takes a deep breath in. She was diagnosed with pneumonia in November. She reports that this feels like that. At that time she was given Rocephin in the emergency department and sent home with azithromycin. She has had no other hospitalizations. She reports that this feels more like pneumonia and then her prior pulmonary embolism. She is on Coumadin. She gets her INR checked once a month. Her last INR was on 12/5 and was "3.something". Her INRs have been very consistent.  The history is provided by the patient. No language interpreter was used.    Past Medical History  Diagnosis Date  . DVT (deep venous thrombosis)   . COPD (chronic obstructive pulmonary disease)   . Bronchitis   . 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 Lupus anticoagulant disorder   . Osteoporosis   . Chronic pain syndrome     Phantom leg pain, right  . History of pleural effusion     Status post sclerotherapy   Past Surgical History  Procedure Laterality Date  . Leg amputation below knee    . Cholecystectomy    . Tonsillectomy    . Gastric bypass     Family History  Problem Relation Age of Onset  . Cancer Father   . Migraines Sister   . Stroke Sister    History  Substance Use Topics  . Smoking status: Current Every Day Smoker -- 0.50 packs/day    Types: Cigarettes  . Smokeless tobacco: Never Used  . Alcohol Use: No   OB History   Grav Para Term  Preterm Abortions TAB SAB Ect Mult Living                 Review of Systems  Constitutional: Negative for fever and chills.  Respiratory: Positive for cough and shortness of breath.   Gastrointestinal: Negative for nausea, vomiting and abdominal pain.  All other systems reviewed and are negative.    Allergies  Review of patient's allergies indicates no known allergies.  Home Medications   Current Outpatient Rx  Name  Route  Sig  Dispense  Refill  . acetaminophen (TYLENOL) 500 MG tablet   Oral   Take 500 mg by mouth every 6 (six) hours as needed for pain.         Marland Kitchen. ADVAIR DISKUS 500-50 MCG/DOSE AEPB   Inhalation   Inhale 1 puff into the lungs 2 (two) times daily.          Marland Kitchen. albuterol (PROVENTIL HFA;VENTOLIN HFA) 108 (90 BASE) MCG/ACT inhaler   Inhalation   Inhale 2 puffs into the lungs every 4 (four) hours as needed for wheezing or shortness of breath.   1 Inhaler   3   . DULoxetine (CYMBALTA) 60 MG capsule   Oral   Take 60 mg by mouth daily.          . fentaNYL (DURAGESIC - DOSED MCG/HR) 50 MCG/HR   Transdermal   Place 1 patch onto  the skin every 3 (three) days.          Marland Kitchen gabapentin (NEURONTIN) 600 MG tablet   Oral   Take 600-1,200 mg by mouth 3 (three) times daily. Take 600mg  in the morning, 600mg  in the afternoon and 1200mg  at bedtime         . guaiFENesin (MUCINEX) 600 MG 12 hr tablet   Oral   Take 600 mg by mouth 2 (two) times daily as needed for cough.         Marland Kitchen ipratropium-albuterol (DUONEB) 0.5-2.5 (3) MG/3ML SOLN   Inhalation   Inhale 3 mLs into the lungs every 4 (four) hours as needed (wheezing).          Marland Kitchen oxyCODONE (ROXICODONE) 15 MG immediate release tablet   Oral   Take 15 mg by mouth every 6 (six) hours as needed for pain.          . Vitamin D, Ergocalciferol, (DRISDOL) 50000 UNITS CAPS capsule   Oral   Take 50,000 Units by mouth every Friday.          . warfarin (COUMADIN) 5 MG tablet   Oral   Take 5 mg by mouth at  bedtime.           BP 112/67  Pulse 100  Temp(Src) 99.8 F (37.7 C) (Oral)  Resp 20  SpO2 95% Physical Exam  Nursing note and vitals reviewed. Constitutional: She is oriented to person, place, and time. She appears well-developed and well-nourished. No distress.  HENT:  Head: Normocephalic and atraumatic.  Right Ear: External ear normal.  Left Ear: External ear normal.  Nose: Nose normal.  Mouth/Throat: Oropharynx is clear and moist.  Eyes: Conjunctivae are normal.  Neck: Normal range of motion.  Cardiovascular: Normal rate, regular rhythm and normal heart sounds.   Pulmonary/Chest: Effort normal. No stridor. No respiratory distress. She has wheezes. She has rales.  Abdominal: Soft. She exhibits no distension.  Musculoskeletal: Normal range of motion.  Neurological: She is alert and oriented to person, place, and time. She has normal strength.  Skin: Skin is warm and dry. She is not diaphoretic. No erythema.  Psychiatric: She has a normal mood and affect. Her behavior is normal.    ED Course  Procedures (including critical care time) Labs Review Labs Reviewed  CBC WITH DIFFERENTIAL - Abnormal; Notable for the following:    WBC 12.7 (*)    Hemoglobin 9.9 (*)    HCT 32.7 (*)    MCV 75.5 (*)    MCH 22.9 (*)    RDW 18.6 (*)    Platelets 420 (*)    Neutro Abs 9.7 (*)    All other components within normal limits  BASIC METABOLIC PANEL - Abnormal; Notable for the following:    Sodium 133 (*)    Chloride 95 (*)    All other components within normal limits   Imaging Review Dg Chest 2 View  07/05/2013   CLINICAL DATA:  Cough and shortness of Breath.  EXAM: CHEST  2 VIEW  COMPARISON:  05/25/2013  FINDINGS: The cardiac silhouette, mediastinal and hilar contours are normal and stable. The lungs demonstrate stable hyperinflation and chronic bronchitic changes. There is a patchy right middle lobe infiltrate. The left lung is clear. The bony thorax is intact. Stable enchondroma in  the humeral metaphysis  IMPRESSION: Right middle lobe infiltrate.   Electronically Signed   By: Loralie Champagne M.D.   On: 07/05/2013 18:51    EKG Interpretation  Date/Time:  Thursday July 05 2013 21:21:07 EST Ventricular Rate:  95 PR Interval:  136 QRS Duration: 76 QT Interval:  314 QTC Calculation: 395 R Axis:   71 Text Interpretation:  Sinus rhythm since last tracing no significant change Confirmed by WENTZ  MD, ELLIOTT ([id number]) on 07/06/2013 12:32:22 AM            MDM  No diagnosis found.  Plan admission for copd, pna, hypoxia. Rocephin and azithromycin given in ED. Pt signed out to [employer], PA-C at change of shift.   2:17 AM After continuous nebulizer patient's oxygen saturation drops into 70s on RA. Pt placed on 5L of oxygen and saturations rise to 90s.   Mora Bellman, PA-C 07/06/13 [phone]

## 2013-07-06 NOTE — Progress Notes (Signed)
INITIAL NUTRITION ASSESSMENT  DOCUMENTATION CODES Per approved criteria  -Not Applicable   INTERVENTION: - Diet advancement per MD - Resource Breeze BID - Will continue to monitor   NUTRITION DIAGNOSIS: Inadequate oral intake related to clear liquid diet as evidenced by diet order.   Goal: Advance diet as tolerated to regular diet  Monitor:  Weights, labs, diet advancement  Reason for Assessment: Malnutrition screening tool, low braden  53 y.o. female  Admitting Dx: Shortness of breath and stabbing pain to chest and back   ASSESSMENT: Pt with hx of cough for the past 3-4 weeks she was diagnosed with minor PNA and was treated with IV antibiotics. She was improving. Today she suddenly developed sharp pains in her back back similar to the ones she had with prior PE. Patient also, developed sudden onset of shortness of breath she came in to ER hypoxic down to 76% on Room Air and wheezing. She was given continuous nebs for 1 hour with some improvement but remained hypoxic. She has had low grade fever at home and chills. Reports no sick contacts. She have had generalized pains and aches. CXR was worrisome for PNA. Pt is not on home oxygen.  Met with pt who reports poor appetite since the end of Thanksgiving with 11 pound unintended weight loss. Reports for the past week she has been forcing herself to eat 3 meals/day. 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 Pt not on any nutritional supplements at home. Performed nutrition focused physical exam except for legs which was WNL.   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  Estimated Nutritional Needs: Kcal: 1700-1900 Protein: 75-90g Fluid: 1.7-1.9L/day  Skin: Intact  Diet Order: Clear Liquid  EDUCATION NEEDS: -No education needs identified at this time   Intake/Output Summary (Last 24 hours) at 07/06/13 1406 Last data filed at 07/06/13 1100  Gross per 24 hour  Intake    825 ml  Output   1700 ml  Net   -875 ml    Last BM: 12/31   Labs:   Recent Labs Lab 07/05/13 1900 07/06/13 0622  NA 133* 138  K 4.2 3.5*  CL 95* 101  CO2 25 25  BUN 6 5*  CREATININE 0.50 0.41*  CALCIUM 9.1 8.3*  GLUCOSE 99 106*    CBG (last 3)  No results found for this basename: GLUCAP,  in the last 72 hours  Scheduled Meds: . antiseptic oral rinse  15 mL Mouth Rinse BID  . azithromycin  500 mg Intravenous Q24H  . benzonatate  100 mg Oral TID  . capsaicin   Topical BID  . ceFEPime (MAXIPIME) IV  1 g Intravenous Q8H  . DULoxetine  60 mg Oral Daily  . [START ON 07/07/2013] fentaNYL  50 mcg Transdermal Q72H  . gabapentin  1,200 mg Oral QHS  . gabapentin  600 mg Oral Custom  . ipratropium-albuterol  3 mL Nebulization Q6H  . mometasone-formoterol  2 puff Inhalation BID  . oseltamivir  75 mg Oral BID  . vancomycin  750 mg Intravenous Q8H  . warfarin  2.5 mg Oral ONCE-1800  . Warfarin - Pharmacist Dosing Inpatient   Does not apply q1800    Continuous Infusions: . sodium chloride 125 mL/hr at 07/06/13 1034    Past Medical History  Diagnosis Date  . DVT (deep venous thrombosis)   . COPD (chronic obstructive pulmonary disease)   . Bronchitis   . 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 Lupus anticoagulant disorder   . Osteoporosis   . Chronic pain syndrome     Phantom leg pain, right  . History of pleural effusion     Status post sclerotherapy  . Chronic anticoagulation     Past Surgical History  Procedure Laterality Date  . Leg amputation below knee Right   . Cholecystectomy    . Tonsillectomy    .  Gastric bypass      Mikey College MS, RD, [place] Pager [phone] After Hours Pager

## 2013-07-06 NOTE — Progress Notes (Signed)
[id number]/Courtany Mcmurphy, RN, BSN, CCM [phone] Chart Reviewed for discharge and hospital needs. Discharge needs at time of review:  None present will follow for needs. Review of patient progress due on [id number]. 

## 2013-07-06 NOTE — H&P (Signed)
PCP: Charlette Caffey, MD  Pain managment [facility]  Chief Complaint:  Shortness of breath and stabbing pain to chest and back  HPI: Robin Prince is a 53 y.o. 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   Presented with  Patient have had some cough for the past 3-4 weeks she was diagnosed with minor PNA and was treated with IV antibiotics. She was improving. Today she suddenly developed sharp pains in her back back similar to the ones she had with prior PE. Patient also, developed sudden onset of shortness of breath she came in to ER hypoxic down to 76% on Room Air and wheezing. She was given continuous nebs for 1 hour with some improvement but remained hypoxic. She has had low grade fever at home and chills. Reports no sick contacts. She have had generalized pains and aches. CXR was worrisome for PNA and hospitalist has been called. Patient is on chronic anticoagulation due to hx of DVT, PE and Lupus anticoagulant. She is NOT on home oxygen.  Review of Systems:    Pertinent positives include: Fevers, chills, fatigue,  chest pain, shortness of breath at rest.  dyspnea on exertion,   productive cough, nausea,  Constitutional:  No weight loss, night sweats, weight loss  HEENT:  No headaches, Difficulty swallowing,Tooth/dental problems,Sore throat,  No sneezing, itching, ear ache, nasal congestion, post nasal drip,  Cardio-vascular:  No Orthopnea, PND, anasarca, dizziness, palpitations.no Bilateral lower extremity swelling  GI:  No heartburn, indigestion, abdominal pain, vomiting, diarrhea, change in bowel habits, loss of appetite, melena, blood in stool, hematemesis Resp:   No excess mucus, no No non-productive cough, No coughing up  of blood.No change in color of mucus.No wheezing. Skin:  no rash or lesions. No jaundice GU:  no dysuria, change in color of urine, no urgency or frequency. No straining to urinate.  No flank pain.  Musculoskeletal:  No joint pain or no joint swelling. No decreased range of motion. No back pain.  Psych:  No change in mood or affect. No depression or anxiety. No memory loss.  Neuro: no localizing neurological complaints, no tingling, no weakness, no double vision, no gait abnormality, no slurred speech, no confusion  Otherwise ROS are negative except for above, 10 systems were reviewed  Past Medical History: Past Medical History  Diagnosis Date  . DVT (deep venous thrombosis)   . COPD (chronic obstructive pulmonary disease)   . Bronchitis   . 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 Lupus anticoagulant disorder   . Osteoporosis   . Chronic pain syndrome     Phantom leg pain, right  . History of pleural effusion     Status post sclerotherapy  . Chronic anticoagulation    Past Surgical History  Procedure Laterality Date  . Leg amputation below knee Right   . Cholecystectomy    . Tonsillectomy    . Gastric bypass       Medications: Prior to Admission medications   Medication Sig Start Date End Date Taking? Authorizing Provider  acetaminophen (TYLENOL) 500 MG tablet Take 500 mg by mouth every 6 (six) hours as needed for pain.   Yes Historical Provider, MD  ADVAIR DISKUS 500-50 MCG/DOSE AEPB Inhale 1 puff into the lungs 2 (two) times daily.  03/06/13  Yes Historical Provider, MD  albuterol (PROVENTIL HFA;VENTOLIN HFA) 108 (90 BASE) MCG/ACT inhaler Inhale 2 puffs into the lungs every 4 (four) hours as needed for wheezing or shortness of breath. 05/25/13  Yes Suzi Roots, MD  DULoxetine (CYMBALTA) 60 MG capsule Take 60 mg by mouth daily.  04/10/13  Yes Historical Provider, MD  fentaNYL (DURAGESIC - DOSED MCG/HR) 50 MCG/HR Place 1 patch onto the skin every 3 (three) days.   04/16/13  Yes Historical Provider, MD  gabapentin (NEURONTIN) 600 MG tablet Take 600-1,200 mg by mouth 3 (three) times daily. Take 600mg  in the morning, 600mg  in the afternoon and 1200mg  at bedtime 04/10/13  Yes Historical Provider, MD  guaiFENesin (MUCINEX) 600 MG 12 hr tablet Take 600 mg by mouth 2 (two) times daily as needed for cough.   Yes Historical Provider, MD  ipratropium-albuterol (DUONEB) 0.5-2.5 (3) MG/3ML SOLN Inhale 3 mLs into the lungs every 4 (four) hours as needed (wheezing).  04/12/13  Yes Historical Provider, MD  oxyCODONE (ROXICODONE) 15 MG immediate release tablet Take 15 mg by mouth every 6 (six) hours as needed for pain.  04/16/13  Yes Historical Provider, MD  Vitamin D, Ergocalciferol, (DRISDOL) 50000 UNITS CAPS capsule Take 50,000 Units by mouth every Friday.  04/10/13  Yes Historical Provider, MD  warfarin (COUMADIN) 5 MG tablet Take 5 mg by mouth at bedtime.  04/10/13  Yes Historical Provider, MD    Allergies:  No Known Allergies  Social History:  Wheelchair bound Lives at   Home alone with two young children   reports that she has been smoking Cigarettes.  She has been smoking about 0.50 packs per day. She has never used smokeless tobacco. She reports that she does not drink alcohol or use illicit drugs.   Family History: family history includes Cancer in her father; Migraines in her sister; Stroke in her sister.    Physical Exam: Patient Vitals for the past 24 hrs:  BP Temp Temp src Pulse Resp SpO2  07/06/13 0044 - - - - - 94 %  07/06/13 0042 99/56 mmHg - - 101 20 93 %  07/05/13 2346 - - - - - 95 %  07/05/13 2159 - - - - - 94 %  07/05/13 2157 112/67 mmHg 99.8 F (37.7 C) Oral 100 20 93 %  07/05/13 2102 - - - - - 95 %  07/05/13 1825 112/63 mmHg 99.7 F (37.6 C) Oral 102 23 93 %    1. General:  in No Acute distress 2. Psychological: Alert and  Oriented 3. Head/ENT:   Dry Mucous Membranes                          Head Non traumatic, neck supple                           Normal   Dentition 4. SKIN:   decreased Skin turgor,  Skin clean Dry and intact no rash 5. Heart: Regular rate and rhythm no Murmur, Rub or gallop 6. Lungs:  some wheezes  crackles  and decreased breath sounds at the bases.  7. Abdomen: Soft, non-tender, Non distended 8. Lower extremities: no clubbing, cyanosis, or edema 9. Neurologically Grossly intact, moving all 3 extremities equally, sp right BKA 10. MSK: Normal range of motion  body mass index is unknown because there is no weight on file.   Labs on Admission:   Recent Labs  07/05/13 1900  NA 133*  K 4.2  CL 95*  CO2 25  GLUCOSE 99  BUN 6  CREATININE 0.50  CALCIUM 9.1   No results found for this basename: AST, ALT, ALKPHOS, BILITOT, PROT, ALBUMIN,  in the last 72 hours No results found for this basename: LIPASE, AMYLASE,  in the last 72 hours  Recent Labs  07/05/13 1900  WBC 12.7*  NEUTROABS 9.7*  HGB 9.9*  HCT 32.7*  MCV 75.5*  PLT 420*   No results found for this basename: CKTOTAL, CKMB, CKMBINDEX, TROPONINI,  in the last 72 hours No results found for this basename: TSH, T4TOTAL, FREET3, T3FREE, THYROIDAB,  in the last 72 hours No results found for this basename: VITAMINB12, FOLATE, FERRITIN, TIBC, IRON, RETICCTPCT,  in the last 72 hours No results found for this basename: HGBA1C    The CrCl is unknown because both a height and weight (above a minimum accepted value) are required for this calculation. ABG No results found for this basename: phart, pco2, po2, hco3, tco2, acidbasedef, o2sat     Lab Results  Component Value Date   DDIMER  Value: 2.78        AT THE INHOUSE ESTABLISHED CUTOFF VALUE OF 0.48 ug/mL FEU, THIS ASSAY HAS BEEN DOCUMENTED IN THE LITERATURE TO HAVE A SENSITIVITY AND NEGATIVE PREDICTIVE VALUE OF AT LEAST 98 TO 99%.  THE TEST RESULT SHOULD BE CORRELATED WITH AN ASSESSMENT OF THE CLINICAL PROBABILITY OF DVT / VTE.* 02/10/2009     Other results:  I have pearsonaly reviewed  this: ECG REPORT  Rate: 95  Rhythm: NSR ST&T Change: NO ischemic changes   Cultures: No results found for this basename: sdes, specrequest, cult, reptstatus       Radiological Exams on Admission: Dg Chest 2 View  07/05/2013   CLINICAL DATA:  Cough and shortness of Breath.  EXAM: CHEST  2 VIEW  COMPARISON:  05/25/2013  FINDINGS: The cardiac silhouette, mediastinal and hilar contours are normal and stable. The lungs demonstrate stable hyperinflation and chronic bronchitic changes. There is a patchy right middle lobe infiltrate. The left lung is clear. The bony thorax is intact. Stable enchondroma in the humeral metaphysis  IMPRESSION: Right middle lobe infiltrate.   Electronically Signed   By: Loralie ChampagneMark  Gallerani M.D.   On: 07/05/2013 18:51    Chart has been reviewed  Assessment/Plan 53 yo F w hx of COPD here with COPD exacerbation and CAP  Present on Admission:  . COPD exacerbation - albuterol, atrovent, antibiotics, given PNA on CXR will hold off the steroids.  . Anemia - anemia panel.  . Febrile illness - Most likely CAP but given sudden worsening and viral like features with obtain influenza PCR and cover with tamiflu for now . CAP (community acquired pneumonia) - admit to stepdown due to hypoxia, broad spectrum antibiotics vancomycin, azithromycin and ceftriaxon   Prophylaxis:   couamdin, Protonix  CODE STATUS: FULL CODE  Other plan as per orders.  I have spent a total of 55 min on this admission  Sharnell Knight 07/06/2013, 2:57 AM

## 2013-07-06 NOTE — Progress Notes (Signed)
Pt admitted this morning from the ED to 2w39, pt is alert x4, assessment and admission hx  Completed, pt's sbp was in the 90s on admission but the went into the 80s, MD was paged, there was no call back but 500cc of NS bolus was ordered on pt which was given and pt's sbp came to the 90s, pt stated her bp is normally low. Pt has been NSR on the heart monitor. Will continue to monitor pt-------Rilynn Habel, rn

## 2013-07-06 NOTE — Progress Notes (Signed)
ANTICOAGULATION CONSULT NOTE - Follow Up Consult  Pharmacy Consult for Warfarin Indication: Hx DVT/PE  No Known Allergies  Patient Measurements: Height: 5\' 8"  (172.7 cm) Weight: 138 lb 10.7 oz (62.9 kg) IBW/kg (Calculated) : 63.9  Vital Signs: Temp: 97.8 F (36.6 C) (01/02 0800) Temp src: Axillary (01/02 0800) BP: 118/58 mmHg (01/02 1130) Pulse Rate: 101 (01/02 1130)  Labs:  Recent Labs  07/05/13 1900 07/06/13 0255 07/06/13 0622 07/06/13 0920  HGB 9.9*  --   --   --   HCT 32.7*  --   --   --   PLT 420*  --   --   --   LABPROT  --  26.7*  --   --   INR  --  2.57*  --   --   CREATININE 0.50  --  0.41*  --   TROPONINI  --  <0.30  --  <0.30    Estimated Creatinine Clearance: 82.6 ml/min (by C-G formula based on Cr of 0.41).   Assessment: 89 yoF admitted with recurrent pneumonia on chronic warfarin PTA for history of DVT/PE and lupus anticoagulant disorder.  PTA dose is 5 mg daily and LD reported at 12/31 PM.  Warfarin 5 mg given early this AM since dose missed yesterday.  INR 2.57  Hgb 9.9, Platelets WNL.  No bleeding reported.  Potential drug-drug interaction with Azithromycin noted.  Goal of Therapy:  INR 2-3 Monitor platelets by anticoagulation protocol: Yes   Plan:  1.  Warfarin 2.5 mg po once at 1800.  Ordered in addition to 5 mg dose given this AM to make up for the dose missed yesterday. 2.  Daily INR.  Clance Bollunyon, Samir Ishaq 07/06/2013,12:28 PM

## 2013-07-07 DIAGNOSIS — R509 Fever, unspecified: Secondary | ICD-10-CM

## 2013-07-07 LAB — BASIC METABOLIC PANEL
BUN: <3 mg/dL — ABNORMAL LOW (ref 6–23)
CO2: 18 mEq/L — ABNORMAL LOW (ref 19–32)
CREATININE: 0.35 mg/dL — AB (ref 0.50–1.10)
Calcium: 8.9 mg/dL (ref 8.4–10.5)
Chloride: 110 mEq/L (ref 96–112)
GFR calc Af Amer: >90 mL/min (ref >90)
Glucose, Bld: 137 mg/dL — ABNORMAL HIGH (ref 70–99)
POTASSIUM: 4.6 meq/L (ref 3.7–5.3)
Sodium: 143 mEq/L (ref 137–147)

## 2013-07-07 LAB — URINE CULTURE

## 2013-07-07 LAB — LEGIONELLA ANTIGEN, URINE: LEGIONELLA ANTIGEN, URINE: NEGATIVE

## 2013-07-07 LAB — RAPID URINE DRUG SCREEN, HOSP PERFORMED
Amphetamines: NOT DETECTED
Barbiturates: NOT DETECTED
Benzodiazepines: NOT DETECTED
Cocaine: NOT DETECTED
OPIATES: NOT DETECTED
Tetrahydrocannabinol: NOT DETECTED

## 2013-07-07 LAB — MAGNESIUM: Magnesium: 1.8 mg/dL (ref 1.5–2.5)

## 2013-07-07 LAB — PROTIME-INR
INR: 4.36 — AB (ref 0.00–1.49)
PROTHROMBIN TIME: 40 s — AB (ref 11.6–15.2)

## 2013-07-07 LAB — PHOSPHORUS: Phosphorus: 3.2 mg/dL (ref 2.3–4.6)

## 2013-07-07 LAB — TSH: TSH: 0.154 u[IU]/mL — ABNORMAL LOW (ref 0.350–4.500)

## 2013-07-07 MED ORDER — METHYLPREDNISOLONE SODIUM SUCC 125 MG IJ SOLR
60.0000 mg | Freq: Three times a day (TID) | INTRAMUSCULAR | Status: DC
  Administered 2013-07-07 – 2013-07-08 (×3): 60 mg via INTRAVENOUS
  Filled 2013-07-07 (×6): qty 0.96

## 2013-07-07 MED ORDER — CEFTRIAXONE SODIUM 1 G IJ SOLR
1.0000 g | INTRAMUSCULAR | Status: DC
  Administered 2013-07-07 – 2013-07-08 (×2): 1 g via INTRAVENOUS
  Filled 2013-07-07 (×3): qty 10

## 2013-07-07 MED ORDER — VITAMIN B-12 1000 MCG PO TABS
1000.0000 ug | ORAL_TABLET | Freq: Every day | ORAL | Status: DC
  Administered 2013-07-07 – 2013-07-09 (×3): 1000 ug via ORAL
  Filled 2013-07-07 (×3): qty 1

## 2013-07-07 MED ORDER — ENSURE COMPLETE PO LIQD
237.0000 mL | Freq: Two times a day (BID) | ORAL | Status: DC
  Administered 2013-07-08 – 2013-07-09 (×2): 237 mL via ORAL

## 2013-07-07 MED ORDER — FERROUS SULFATE 325 (65 FE) MG PO TABS
325.0000 mg | ORAL_TABLET | Freq: Three times a day (TID) | ORAL | Status: DC
  Administered 2013-07-07 – 2013-07-09 (×6): 325 mg via ORAL
  Filled 2013-07-07 (×8): qty 1

## 2013-07-07 NOTE — ED Provider Notes (Signed)
Medical screening examination/treatment/procedure(s) were performed by non-physician practitioner and as supervising physician I was immediately available for consultation/collaboration.  Flint MelterElliott L Torii Royse, MD 07/07/13 [phone]

## 2013-07-07 NOTE — Progress Notes (Signed)
[facility] PROGRESS NOTE  Robin Prince ZOX:[id number] DOB: April 17, 1961 DOA: 07/05/2013 PCP: Colonel Bald, MD  Assessment/Plan  Persistent hypotension despite fluid resuscitation was likely due to relative adrenal insufficiency.  Patient has been on recurrent doses of steroids this year.  Procalcitonin was low  -  Cortisol level:  9, below excepted given degree of illness (COPD exac, PNA, possible sepsis, hypotension) -  BP immediately well to steroids -  Decrease IVF -  See abx below -  Monitor for bleeding -  Repeat troponin neg  COPD exac -  Continue BD -  Wean to q8h solumedrol today -  Continue abx, see below  CAP, hypotension more likely related to adrenal insufficiency and cultures neg so will deescalate abx -  D/c vanc, cefepime -  Restart Ceftriaxone + azithromycin -  Pro-calcitonin is negative   Febrile illness - influenza PCR neg -  Resp viral panel neg  Microcytic anemia due to vitamin B12 and iron deficiency -  Start vitamin B12 and ferrous sulfate -  Folate wnl -  Will need GI follow up for iron deficiency anemia  Chronic pain and high dose narcotics -  Continue fentanyl patch and oxycodone prn -  Continue gabapentin  Lupus anticoagulant with hx of DVT -  Continue coumadin, dosed by pharmacy -  INR supratherapeutic  Diet:  regular Access:  PIV IVF:  yes Proph:  Warfarin   Code Status: full Family Communication: patient alone Disposition Plan:  Okay to transfer to telemetry   Consultants:  Discussed with PCCM  Procedures:  CT angio chest   Antibiotics:  vanc 1/2 >>  Cefepime 1/2 >>  azithro 1/2 >>   HPI/Subjective:  States she feels tired, still has bad cough, less SOB and overall feeling better.    Objective: Filed Vitals:   07/07/13 0900 07/07/13 1000 07/07/13 1100 07/07/13 1200  BP: 114/66 107/72 105/54 124/69  Pulse: 80 81 59 91  Temp:    97.9 F (36.6 C)  TempSrc:    Axillary  Resp: _0 Height:      Weight:      SpO2: 95% 92% 94% 94%    Intake/Output Summary (Last 24 hours) at 07/07/13 1225 Last data filed at 07/07/13 1200  Gross per 24 hour  Intake   7625 ml  Output   3825 ml  Net   3800 ml   Filed Weights   07/06/13 0359 07/06/13 0457 07/07/13 0400  Weight: 67.6 kg (149 lb 0.5 oz) 62.9 kg (138 lb 10.7 oz) 67.6 kg (149 lb 0.5 oz)    Exam:   General:  Thin CF, No acute distress  HEENT:  NCAT, MMM  Cardiovascular:  RRR, nl S1, S2 no mrg, 2+ pulses, warm extremities  Respiratory:  Diminished BS right side/bronchial, no wheeze, + rhonchi, no increased WOB  Abdomen:   NABS, soft, NT/ND  MSK:   Normal tone and bulk, no LEE  Neuro:  Grossly intact  Data Reviewed: Basic Metabolic Panel:  Recent Labs Lab 07/05/13 1900 07/06/13 0622 07/07/13 0425  NA 133* 138 143  K 4.2 3.5* 4.6  CL 95* 101 110  CO2 25 25 18*  GLUCOSE 99 106* 137*  BUN 6 5* <3*  CREATININE 0.50 0.41* 0.35*  CALCIUM 9.1 8.3* 8.9  MG  --   --  1.8  PHOS  --   --  3.2   Liver Function Tests:  Recent Labs Lab 07/06/13 0622  AST 10  ALT 5  ALKPHOS 84  BILITOT 0.3  PROT 6.1  ALBUMIN 2.6*   No results found for this basename: LIPASE, AMYLASE,  in the last 168 hours No results found for this basename: AMMONIA,  in the last 168 hours CBC:  Recent Labs Lab 07/05/13 1900 07/06/13 1235  WBC 12.7* 8.2  NEUTROABS 9.7*  --   HGB 9.9* 8.2*  HCT 32.7* 26.9*  MCV 75.5* 76.4*  PLT 420* 296   Cardiac Enzymes:  Recent Labs Lab 07/06/13 0255 07/06/13 0920 07/06/13 1235  TROPONINI <0.30 <0.30 <0.30   BNP (last 3 results) No results found for this basename: PROBNP,  in the last 8760 hours CBG: No results found for this basename: GLUCAP,  in the last 168 hours  Recent Results (from the past 240 hour(s))  URINE CULTURE     Status: None   Collection Time    07/06/13  6:35 AM      Result Value Range Status   Specimen Description URINE, RANDOM   Final   Special  Requests NONE   Final   Culture  Setup Time     Final   Value: 07/06/2013 12:55     Performed at [facility]     Final   Value: 30,000 COLONIES/ML     Performed at [facility]   Culture     Final   Value: Multiple bacterial morphotypes present, none predominant. Suggest appropriate recollection if clinically indicated.     Performed at [facility]   Report Status 07/07/2013 FINAL   Final  RESPIRATORY VIRUS PANEL     Status: None   Collection Time    07/06/13  6:52 AM      Result Value Range Status   Source - RVPAN NASOPHARYNGEAL   Final   Respiratory Syncytial Virus A NOT DETECTED   Final   Respiratory Syncytial Virus B NOT DETECTED   Final   Influenza A NOT DETECTED   Final   Influenza B NOT DETECTED   Final   Parainfluenza 1 NOT DETECTED   Final   Parainfluenza 2 NOT DETECTED   Final   Parainfluenza 3 NOT DETECTED   Final   Metapneumovirus NOT DETECTED   Final   Rhinovirus NOT DETECTED   Final   Adenovirus NOT DETECTED   Final   Influenza A H1 NOT DETECTED   Final   Influenza A H3 NOT DETECTED   Final   Comment: (NOTE)           Normal Reference Range for each Analyte: NOT DETECTED     Testing performed using the Luminex xTAG Respiratory Viral Panel test     kit.     This test was developed and its performance characteristics determined     by [facility]. It has not been cleared or approved by the [place]     Food and Drug Administration. This test is used for clinical purposes.     It should not be regarded as investigational or for research. This     laboratory is certified under the [place] (CLIA) as qualified to perform high complexity     clinical laboratory testing.     Performed at [facility] PCR SCREENING     Status: None   Collection Time    07/06/13  6:58 AM      Result Value Range Status  MRSA by PCR NEGATIVE  NEGATIVE Final   Comment:             The GeneXpert MRSA Assay (FDA     approved for NASAL specimens     only), is one component of a     comprehensive MRSA colonization     surveillance program. It is not     intended to diagnose MRSA     infection nor to guide or     monitor treatment for     MRSA infections.     Studies: Dg Chest 2 View  07/05/2013   CLINICAL DATA:  Cough and shortness of Breath.  EXAM: CHEST  2 VIEW  COMPARISON:  05/25/2013  FINDINGS: The cardiac silhouette, mediastinal and hilar contours are normal and stable. The lungs demonstrate stable hyperinflation and chronic bronchitic changes. There is a patchy right middle lobe infiltrate. The left lung is clear. The bony thorax is intact. Stable enchondroma in the humeral metaphysis  IMPRESSION: Right middle lobe infiltrate.   Electronically Signed   By: Kalman Jewels M.D.   On: 07/05/2013 18:51   Ct Angio Chest Pe W/cm &/or Wo Cm  07/06/2013   CLINICAL DATA:  Shortness of breath. Productive cough. Chest pain. White cell count 12.7.  EXAM: CT ANGIOGRAPHY CHEST WITH CONTRAST  TECHNIQUE: Multidetector CT imaging of the chest was performed using the standard protocol during bolus administration of intravenous contrast. Multiplanar CT image reconstructions including MIPs were obtained to evaluate the vascular anatomy.  CONTRAST:  196m OMNIPAQUE IOHEXOL 350 MG/ML SOLN  COMPARISON:  01/05/2010  FINDINGS: Technically adequate study with good opacification of the central and segmental pulmonary arteries. No focal filling defects demonstrated. No evidence of significant pulmonary embolus.  Normal heart size. Normal caliber thoracic aorta. No dissection. Prominent mediastinal and right hilar lymph nodes are demonstrated, measuring up to about 1.8 cm Ambre Kobayashi axis dimension. These are demonstrating some progression since previous study. These are likely to be reactive although followup is recommended to exclude lymphoproliferative process. Esophagus is decompressed.  There is diffuse  patchy nodular infiltration in the lungs with tree-in-bud pattern consistent with bronchiolitis. Focal consolidation seen in the right middle lung. No pneumothorax. Diffuse airways thickening. Airways are patent. No pleural effusions. Probable postoperative changes in the upper stomach.  Review of the MIP images confirms the above findings.  IMPRESSION: No evidence of significant pulmonary embolus. Diffuse bilateral tree-in-bud parenchymal pattern to the lungs suggesting bronchiolitis with focal consolidation in the right middle lung. Right hilar and mediastinal lymph nodes are enlarged, likely reactive given the inflammatory process. Follow-up recommended to exclude underlying neoplastic change.   Electronically Signed   By: WLucienne CapersM.D.   On: 07/06/2013 04:39    Scheduled Meds: . antiseptic oral rinse  15 mL Mouth Rinse BID  . azithromycin  500 mg Intravenous Q24H  . benzonatate  100 mg Oral TID  . capsaicin   Topical BID  . ceFEPime (MAXIPIME) IV  1 g Intravenous Q8H  . DULoxetine  60 mg Oral Daily  . feeding supplement (RESOURCE BREEZE)  1 Container Oral BID BM  . fentaNYL  50 mcg Transdermal Q72H  . gabapentin  1,200 mg Oral QHS  . gabapentin  600 mg Oral Custom  . ipratropium-albuterol  3 mL Nebulization Q6H  . methylPREDNISolone (SOLU-MEDROL) injection  60 mg Intravenous Q6H  . mometasone-formoterol  2 puff Inhalation BID  . vancomycin  750 mg Intravenous Q8H  . Warfarin - Pharmacist Dosing Inpatient  Does not apply q1800   Continuous Infusions: . sodium chloride 75 mL/hr at 07/07/13 0800    Active Problems:   COPD exacerbation   Anemia   Febrile illness   CAP (community acquired pneumonia)   History of DVT of lower extremity    Time spent: 30 min    Patina Spanier, [facility] Pager [phone]. If 7PM-7AM, please contact night-coverage at [email or web address], password [facility] 07/07/2013, 12:25 PM  LOS: 2 days

## 2013-07-07 NOTE — Plan of Care (Signed)
Problem: Phase II Progression Outcomes Goal: Pain controlled Outcome: Progressing Pt states that Cymbalta helps her RLE phantom pain significantly.  Pt still has Oxycodone for pain as needed.  Goal: Tolerating diet Outcome: Not Progressing Pt has poor appetite. And does not like the Boost Breeze that has been ordered.

## 2013-07-07 NOTE — Progress Notes (Signed)
ANTICOAGULATION CONSULT NOTE - Follow Up Consult  Pharmacy Consult for Warfarin Indication: Hx DVT/PE  No Known Allergies  Patient Measurements: Height: 5\' 8"  (172.7 cm) Weight: 149 lb 0.5 oz (67.6 kg) IBW/kg (Calculated) : 63.9  Vital Signs: Temp: 97.4 F (36.3 C) (01/03 0800) Temp src: Axillary (01/03 0800) BP: 116/60 mmHg (01/03 0800) Pulse Rate: 70 (01/03 0800)  Labs:  Recent Labs  07/05/13 1900 07/06/13 0255 07/06/13 0622 07/06/13 0920 07/06/13 1235 07/07/13 0425  HGB 9.9*  --   --   --  8.2*  --   HCT 32.7*  --   --   --  26.9*  --   PLT 420*  --   --   --  296  --   LABPROT  --  26.7*  --   --   --  40.0*  INR  --  2.57*  --   --   --  4.36*  CREATININE 0.50  --  0.41*  --   --  0.35*  TROPONINI  --  <0.30  --  <0.30 <0.30  --     Estimated Creatinine Clearance: 83.9 ml/min (by C-G formula based on Cr of 0.35).   Assessment: [age] yoF admitted with recurrent pneumonia on chronic warfarin PTA for history of DVT/PE and lupus anticoagulant disorder. No PE per CTA on 1/1.  PTA dose is 5 mg daily and LD reported at 12/31 PM.  Warfarin 5 mg given early this AM since dose missed yesterday.  INR today = 4.36  No dose taken 1/1 but given 5mg  at 10am and 2.5mg  at 18:00 on 1/2  No CBC today, 1/20= Hgb 9.9, Platelets WNL.  No bleeding reported.  Potential drug-drug interaction with steroids and antibiotics  Goal of Therapy:  INR 2-3 Monitor platelets by anticoagulation protocol: Yes   Plan:  1.  INR SUPRAtherapeutic this am, no warfarin today 2.  Daily INR.  Juliette Alcideustin Kayman Snuffer, PharmD, BCPS.   Pager: [phone]  07/07/2013,10:30 AM

## 2013-07-08 LAB — BASIC METABOLIC PANEL
BUN: 6 mg/dL (ref 6–23)
CALCIUM: 9 mg/dL (ref 8.4–10.5)
CO2: 22 mEq/L (ref 19–32)
Chloride: 109 mEq/L (ref 96–112)
Creatinine, Ser: 0.38 mg/dL — ABNORMAL LOW (ref 0.50–1.10)
GFR calc Af Amer: >90 mL/min (ref >90)
GFR calc non Af Amer: >90 mL/min (ref >90)
GLUCOSE: 138 mg/dL — AB (ref 70–99)
Potassium: 3.5 mEq/L — ABNORMAL LOW (ref 3.7–5.3)
Sodium: 143 mEq/L (ref 137–147)

## 2013-07-08 LAB — T4, FREE: Free T4: 1.19 ng/dL (ref 0.80–1.80)

## 2013-07-08 LAB — CBC
HCT: 27 % — ABNORMAL LOW (ref 36.0–46.0)
Hemoglobin: 8.4 g/dL — ABNORMAL LOW (ref 12.0–15.0)
MCH: 23.4 pg — AB (ref 26.0–34.0)
MCHC: 31.1 g/dL (ref 30.0–36.0)
MCV: 75.2 fL — AB (ref 78.0–100.0)
PLATELETS: 415 10*3/uL — AB (ref 150–400)
RBC: 3.59 MIL/uL — ABNORMAL LOW (ref 3.87–5.11)
RDW: 18.8 % — AB (ref 11.5–15.5)
WBC: 11.1 10*3/uL — ABNORMAL HIGH (ref 4.0–10.5)

## 2013-07-08 LAB — PROCALCITONIN: Procalcitonin: <0.1 ng/mL

## 2013-07-08 LAB — PROTIME-INR
INR: 7.12 — AB (ref 0.00–1.49)
Prothrombin Time: 58.2 seconds — ABNORMAL HIGH (ref 11.6–15.2)

## 2013-07-08 LAB — T3, FREE: T3, Free: 2.8 pg/mL (ref 2.3–4.2)

## 2013-07-08 MED ORDER — BENZONATATE 100 MG PO CAPS
200.0000 mg | ORAL_CAPSULE | Freq: Three times a day (TID) | ORAL | Status: DC
  Administered 2013-07-08 – 2013-07-09 (×3): 200 mg via ORAL
  Filled 2013-07-08 (×5): qty 2

## 2013-07-08 MED ORDER — PHYTONADIONE 5 MG PO TABS
5.0000 mg | ORAL_TABLET | Freq: Once | ORAL | Status: AC
  Administered 2013-07-08: 5 mg via ORAL
  Filled 2013-07-08: qty 1

## 2013-07-08 MED ORDER — METHYLPREDNISOLONE SODIUM SUCC 125 MG IJ SOLR
60.0000 mg | Freq: Two times a day (BID) | INTRAMUSCULAR | Status: AC
  Administered 2013-07-08: 60 mg via INTRAVENOUS
  Filled 2013-07-08: qty 0.96

## 2013-07-08 MED ORDER — PREDNISONE 50 MG PO TABS
60.0000 mg | ORAL_TABLET | Freq: Every day | ORAL | Status: DC
  Administered 2013-07-09: 60 mg via ORAL
  Filled 2013-07-08 (×2): qty 1

## 2013-07-08 MED ORDER — METHYLPREDNISOLONE SODIUM SUCC 125 MG IJ SOLR: 60.0000 mg | Freq: Two times a day (BID) | INTRAMUSCULAR | Status: DC

## 2013-07-08 NOTE — Progress Notes (Signed)
CRITICAL VALUE ALERT  Critical value received:  INR- 7.12  Date of notification:  07/08/13  Time of notification:  0644  Critical value read back:yes  Nurse who received alert:  Sharlynn OliphantMeredith Jalasia Eskridge RN  MD notified (1st page):  M. Lynch  Time of first page:  [phone]  MD notified (2nd page):  Time of second page:  Responding MD:  M.Burnadette PeterLynch   Time MD responded:  [phone]

## 2013-07-08 NOTE — Progress Notes (Signed)
[facility] PROGRESS NOTE  Robin Prince:[id number] DOB: [birth year] DOA: 07/05/2013 PCP: Colonel Bald, MD  Assessment/Plan  Persistent hypotension despite fluid resuscitation was likely due to relative adrenal insufficiency.  Patient has been on recurrent doses of steroids this year.  Procalcitonin was low.   -  Cortisol level:  9, below excepted given degree of illness (COPD exac, PNA, possible sepsis, hypotension) -  BP responded immediately well to steroids -  See abx below -  Monitor for bleeding -  Repeat troponin neg Telemetry: Normal sinus rhythm, okay to DC telemetry-    COPD exac -  Continue BD -  Wean to q12h solumedrol today -  Continue abx, see below  CAP, hypotension more likely related to adrenal insufficiency and cultures neg so will deescalate abx -  Continue Ceftriaxone + azithromycin -  Pro-calcitonin is negative   Febrile illness - influenza PCR neg -  Resp viral panel neg  Microcytic anemia due to vitamin B12 and iron deficiency -  Start vitamin B12 and ferrous sulfate -  Folate wnl -  Will need GI follow up for iron deficiency anemia  Chronic pain and high dose narcotics -  Continue fentanyl patch and oxycodone prn -  Continue gabapentin  Lupus anticoagulant with hx of DVT -  Continue coumadin, dosed by pharmacy -  INR supratherapeutic -  Vitamin K x1  Sick euthyroid, Abnormal TSH (0.154), free T3 and free T4 within normal limits.  TSH may be suppressed because of IV steroids. -  Repeat thyroid function tests in 4 weeks.   Diet:  regular Access:  PIV IVF:  yes Proph:  Warfarin   Code Status: full Family Communication: patient alone Disposition Plan:  Okay to transfer to telemetry   Consultants:  Discussed with PCCM  Procedures:  CT angio chest   Antibiotics:  vanc 1/2 >>  Cefepime 1/2 >>  azithro 1/2 >>   HPI/Subjective:  States she feels tired, still has bad cough, less SOB.  Was able to cannulate  to the bathroom a few times and is feeling better.  Less pain of the right posterior back   Objective: Filed Vitals:   07/08/13 0155 07/08/13 0447 07/08/13 0823 07/08/13 1058  BP: 110/58 107/54  109/61  Pulse:  71  72  Temp:  98.1 F (36.7 C)  98.1 F (36.7 C)  TempSrc:  Oral  Oral  Resp:  18  18  Height:      Weight:      SpO2:  93% 95% 99%    Intake/Output Summary (Last 24 hours) at 07/08/13 1338 Last data filed at 07/08/13 1101  Gross per 24 hour  Intake   1890 ml  Output   1800 ml  Net     90 ml   Filed Weights   07/06/13 0359 07/06/13 0457 07/07/13 0400  Weight: 67.6 kg (149 lb 0.5 oz) 62.9 kg (138 lb 10.7 oz) 67.6 kg (149 lb 0.5 oz)    Exam:   General:  Thin CF, No acute distress  HEENT:  NCAT, MMM  Cardiovascular:  RRR, nl S1, S2 no mrg, 2+ pulses, warm extremities  Respiratory:  Diminished BS right side/bronchial, no wheeze, + rhonchi, stable from prior, no increased WOB  Abdomen:   NABS, soft, NT/ND  MSK:   Normal tone and bulk, no LEE  Neuro:  Grossly intact  Data Reviewed: Basic Metabolic Panel:  Recent Labs Lab 07/05/13 1900 07/06/13 0622 07/07/13 0425 07/08/13 0548  NA 133*  138 143 143  K 4.2 3.5* 4.6 3.5*  CL 95* 101 110 109  CO2 25 25 18* 22  GLUCOSE 99 106* 137* 138*  BUN 6 5* <3* 6  CREATININE 0.50 0.41* 0.35* 0.38*  CALCIUM 9.1 8.3* 8.9 9.0  MG  --   --  1.8  --   PHOS  --   --  3.2  --    Liver Function Tests:  Recent Labs Lab 07/06/13 0622  AST 10  ALT 5  ALKPHOS 84  BILITOT 0.3  PROT 6.1  ALBUMIN 2.6*   No results found for this basename: LIPASE, AMYLASE,  in the last 168 hours No results found for this basename: AMMONIA,  in the last 168 hours CBC:  Recent Labs Lab 07/05/13 1900 07/06/13 1235 07/08/13 0548  WBC 12.7* 8.2 11.1*  NEUTROABS 9.7*  --   --   HGB 9.9* 8.2* 8.4*  HCT 32.7* 26.9* 27.0*  MCV 75.5* 76.4* 75.2*  PLT 420* 296 415*   Cardiac Enzymes:  Recent Labs Lab 07/06/13 0255  07/06/13 0920 07/06/13 1235  TROPONINI <0.30 <0.30 <0.30   BNP (last 3 results) No results found for this basename: PROBNP,  in the last 8760 hours CBG: No results found for this basename: GLUCAP,  in the last 168 hours  Recent Results (from the past 240 hour(s))  CULTURE, BLOOD (ROUTINE X 2)     Status: None   Collection Time    07/06/13  3:35 AM      Result Value Range Status   Specimen Description BLOOD RIGHT FOREARM   Final   Special Requests BOTTLES DRAWN AEROBIC AND ANAEROBIC 5CC   Final   Culture  Setup Time     Final   Value: 07/06/2013 09:03     Performed at [facility]   Culture     Final   Value:        BLOOD CULTURE RECEIVED NO GROWTH TO DATE CULTURE WILL BE HELD FOR 5 DAYS BEFORE ISSUING A FINAL NEGATIVE REPORT     Performed at [facility]   Report Status PENDING   Incomplete  CULTURE, BLOOD (ROUTINE X 2)     Status: None   Collection Time    07/06/13  4:25 AM      Result Value Range Status   Specimen Description BLOOD RIGHT HAND   Final   Special Requests BOTTLES DRAWN AEROBIC AND ANAEROBIC 4CC   Final   Culture  Setup Time     Final   Value: 07/06/2013 09:03     Performed at [facility]   Culture     Final   Value:        BLOOD CULTURE RECEIVED NO GROWTH TO DATE CULTURE WILL BE HELD FOR 5 DAYS BEFORE ISSUING A FINAL NEGATIVE REPORT     Performed at [facility]   Report Status PENDING   Incomplete  URINE CULTURE     Status: None   Collection Time    07/06/13  6:35 AM      Result Value Range Status   Specimen Description URINE, RANDOM   Final   Special Requests NONE   Final   Culture  Setup Time     Final   Value: 07/06/2013 12:55     Performed at [facility]     Final   Value: 30,000 COLONIES/ML     Performed at [facility]  Final   Value: Multiple bacterial morphotypes present, none predominant. Suggest appropriate recollection if clinically indicated.     Performed at  [facility]   Report Status 07/07/2013 FINAL   Final  RESPIRATORY VIRUS PANEL     Status: None   Collection Time    07/06/13  6:52 AM      Result Value Range Status   Source - [facility]VPAN NASOPHARYNGEAL   Final   Respiratory Syncytial Virus A NOT DETECTED   Final   Respiratory Syncytial Virus B NOT DETECTED   Final   Influenza A NOT DETECTED   Final   Influenza B NOT DETECTED   Final   Parainfluenza 1 NOT DETECTED   Final   Parainfluenza 2 NOT DETECTED   Final   Parainfluenza 3 NOT DETECTED   Final   Metapneumovirus NOT DETECTED   Final   Rhinovirus NOT DETECTED   Final   Adenovirus NOT DETECTED   Final   Influenza A H1 NOT DETECTED   Final   Influenza A H3 NOT DETECTED   Final   Comment: (NOTE)           Normal Reference Range for each Analyte: NOT DETECTED     Testing performed using the Luminex xTAG Respiratory Viral Panel test     kit.     This test was developed and its performance characteristics determined     by [facility]. It has not been cleared or approved by the [place]     Food and Drug Administration. This test is used for clinical purposes.     It should not be regarded as investigational or for research. This     laboratory is certified under the [place] (CLIA) as qualified to perform high complexity     clinical laboratory testing.     Performed at [facility] PCR SCREENING     Status: None   Collection Time    07/06/13  6:58 AM      Result Value Range Status   MRSA by PCR NEGATIVE  NEGATIVE Final   Comment:            The GeneXpert MRSA Assay (FDA     approved for NASAL specimens     only), is one component of a     comprehensive MRSA colonization     surveillance program. It is not     intended to diagnose MRSA     infection nor to guide or     monitor treatment for     MRSA infections.     Studies: No results found.  Scheduled Meds: . antiseptic oral rinse  15 mL Mouth Rinse  BID  . azithromycin  500 mg Intravenous Q24H  . benzonatate  200 mg Oral TID  . cefTRIAXone (ROCEPHIN)  IV  1 g Intravenous Q24H  . DULoxetine  60 mg Oral Daily  . feeding supplement (ENSURE COMPLETE)  237 mL Oral BID BM  . fentaNYL  50 mcg Transdermal Q72H  . ferrous sulfate  325 mg Oral TID WC  . gabapentin  1,200 mg Oral QHS  . gabapentin  600 mg Oral Custom  . ipratropium-albuterol  3 mL Nebulization Q6H  . methylPREDNISolone (SOLU-MEDROL) injection  60 mg Intravenous Q12H  . mometasone-formoterol  2 puff Inhalation BID  . [START ON 07/09/2013] predniSONE  60 mg Oral Q breakfast  . vitamin B-12  1,000 mcg Oral  Daily  . Warfarin - Pharmacist Dosing Inpatient   Does not apply q1800   Continuous Infusions: . sodium chloride 10 mL (07/08/13 1152)    Active Problems:   COPD exacerbation   Anemia   Febrile illness   CAP (community acquired pneumonia)   History of DVT of lower extremity    Time spent: 30 min    Alea Ryer, [facility] Pager [phone]. If 7PM-7AM, please contact night-coverage at [email or web address], password [facility] 07/08/2013, 1:38 PM  LOS: 3 days

## 2013-07-08 NOTE — Progress Notes (Signed)
ANTICOAGULATION CONSULT NOTE - Follow Up Consult  Pharmacy Consult for Warfarin Indication: Hx DVT/PE  No Known Allergies  Patient Measurements: Height: 5\' 8"  (172.7 cm) Weight: 149 lb 0.5 oz (67.6 kg) IBW/kg (Calculated) : 63.9  Vital Signs: Temp: 98.1 F (36.7 C) (01/04 0447) Temp src: Oral (01/04 0447) BP: 107/54 mmHg (01/04 0447) Pulse Rate: 71 (01/04 0447)  Labs:  Recent Labs  07/05/13 1900 07/06/13 0255 07/06/13 0622 07/06/13 0920 07/06/13 1235 07/07/13 0425 07/08/13 0548  HGB 9.9*  --   --   --  8.2*  --  8.4*  HCT 32.7*  --   --   --  26.9*  --  27.0*  PLT 420*  --   --   --  296  --  415*  LABPROT  --  26.7*  --   --   --  40.0* 58.2*  INR  --  2.57*  --   --   --  4.36* 7.12*  CREATININE 0.50  --  0.41*  --   --  0.35* 0.38*  TROPONINI  --  <0.30  --  <0.30 <0.30  --   --     Estimated Creatinine Clearance: 83.9 ml/min (by C-G formula based on Cr of 0.38).   Assessment: [age] yoF admitted with recurrent pneumonia on chronic warfarin PTA for history of DVT/PE and lupus anticoagulant disorder. No PE per CTA on 1/1.  PTA dose is 5 mg daily and LD reported at 12/31 PM.    No dose taken 1/1 but given 5mg  at 10am and 2.5mg  at 18:00 on 1/2  INR supratherapeutic and rising (7.12) - vitamin k 5mg  PO given per MD  CBC low but stable.  No bleeding reported.  Potential drug-drug interaction with steroids and antibiotics (zithromax) and increased warfarin sensitivity with little PO intake  Goal of Therapy:  INR 2-3 Monitor platelets by anticoagulation protocol: Yes   Plan:  1.  No warfarin today 2.  Daily INR.  Loralee PacasErin Charlett Merkle, PharmD, BCPS Pager: [phone] 07/08/2013,8:52 AM

## 2013-07-09 DIAGNOSIS — R6520 Severe sepsis without septic shock: Secondary | ICD-10-CM

## 2013-07-09 DIAGNOSIS — D518 Other vitamin B12 deficiency anemias: Secondary | ICD-10-CM

## 2013-07-09 DIAGNOSIS — E2749 Other adrenocortical insufficiency: Secondary | ICD-10-CM

## 2013-07-09 DIAGNOSIS — J9600 Acute respiratory failure, unspecified whether with hypoxia or hypercapnia: Secondary | ICD-10-CM

## 2013-07-09 DIAGNOSIS — E2740 Unspecified adrenocortical insufficiency: Secondary | ICD-10-CM

## 2013-07-09 DIAGNOSIS — A419 Sepsis, unspecified organism: Secondary | ICD-10-CM

## 2013-07-09 DIAGNOSIS — J9601 Acute respiratory failure with hypoxia: Secondary | ICD-10-CM

## 2013-07-09 LAB — PROTIME-INR
INR: 1.79 — ABNORMAL HIGH (ref 0.00–1.49)
Prothrombin Time: 20.3 seconds — ABNORMAL HIGH (ref 11.6–15.2)

## 2013-07-09 LAB — BASIC METABOLIC PANEL
BUN: 8 mg/dL (ref 6–23)
CHLORIDE: 108 meq/L (ref 96–112)
CO2: 24 meq/L (ref 19–32)
CREATININE: 0.44 mg/dL — AB (ref 0.50–1.10)
Calcium: 8.8 mg/dL (ref 8.4–10.5)
GFR calc Af Amer: >90 mL/min (ref >90)
GFR calc non Af Amer: >90 mL/min (ref >90)
Glucose, Bld: 96 mg/dL (ref 70–99)
Potassium: 3.2 mEq/L — ABNORMAL LOW (ref 3.7–5.3)
Sodium: 143 mEq/L (ref 137–147)

## 2013-07-09 LAB — CBC
HEMATOCRIT: 28.1 % — AB (ref 36.0–46.0)
HEMOGLOBIN: 8.5 g/dL — AB (ref 12.0–15.0)
MCH: 23.2 pg — ABNORMAL LOW (ref 26.0–34.0)
MCHC: 30.2 g/dL (ref 30.0–36.0)
MCV: 76.8 fL — AB (ref 78.0–100.0)
Platelets: 372 10*3/uL (ref 150–400)
RBC: 3.66 MIL/uL — ABNORMAL LOW (ref 3.87–5.11)
RDW: 19 % — AB (ref 11.5–15.5)
WBC: 9 10*3/uL (ref 4.0–10.5)

## 2013-07-09 MED ORDER — WARFARIN SODIUM 2.5 MG PO TABS
2.5000 mg | ORAL_TABLET | Freq: Once | ORAL | Status: DC
  Filled 2013-07-09: qty 1

## 2013-07-09 MED ORDER — BENZONATATE 200 MG PO CAPS: 200.0000 mg | ORAL_CAPSULE | Freq: Three times a day (TID) | ORAL | Status: AC

## 2013-07-09 MED ORDER — AMOXICILLIN-POT CLAVULANATE 875-125 MG PO TABS: 1.0000 | ORAL_TABLET | Freq: Two times a day (BID) | ORAL | Status: DC

## 2013-07-09 MED ORDER — ENSURE COMPLETE PO LIQD: 237.0000 mL | Freq: Two times a day (BID) | ORAL | Status: DC

## 2013-07-09 MED ORDER — FERROUS SULFATE 325 (65 FE) MG PO TABS: 325.0000 mg | ORAL_TABLET | Freq: Three times a day (TID) | ORAL | Status: AC

## 2013-07-09 MED ORDER — POTASSIUM CHLORIDE CRYS ER 20 MEQ PO TBCR
40.0000 meq | EXTENDED_RELEASE_TABLET | Freq: Once | ORAL | Status: AC
  Administered 2013-07-09: 40 meq via ORAL
  Filled 2013-07-09: qty 2

## 2013-07-09 MED ORDER — PREDNISONE 10 MG PO TABS: ORAL_TABLET | ORAL | Status: AC

## 2013-07-09 MED ORDER — CYANOCOBALAMIN 1000 MCG PO TABS: 1000.0000 ug | ORAL_TABLET | Freq: Every day | ORAL | Status: AC

## 2013-07-09 NOTE — Progress Notes (Signed)
Discharge instructions explained using teach back.Stable for discharge.

## 2013-07-09 NOTE — Discharge Summary (Addendum)
Physician Discharge Summary  Robin Prince OFB:[id number] DOB: [birth year] DOA: 07/05/2013  PCP: Colonel Bald, MD  Admit date: 07/05/2013 Discharge date: 07/09/2013  Recommendations for Outpatient Follow-up:  1. Follow up with primary care doctor within 2-4 weeks of discharge.   2. Formal PFTs for COPD if not already complete.   3. Please do formal outpatient evaluation for adrenal insufficiency.  Due to hypotension and need for urgent steroids, cortisol stim test was not completed during hospitalization.   4. GI referral due to age + iron-deficiency anemia.   5. Repeat thyroid function tests in 4 weeks.  6. INR check in 2 days, warfarin 2.43m tonight and then resume previous dose 7. Repeat CT chest in 4-6 weeks to follow up enlarged mediastinal and hilar LN and exclude neoplastic disease 8. F/u pending blood cultures  Discharge Diagnoses:  Principal Problem:   CAP (community acquired pneumonia) Active Problems:   COPD exacerbation   Anemia, iron deficiency   Febrile illness   History of DVT of lower extremity   Adrenal insufficiency   Acute respiratory failure with hypoxia   Severe sepsis   Vitamin B12 deficiency (dietary) anemia   Discharge Condition: stable, improved  Diet recommendation: regular  Wt Readings from Last 3 Encounters:  07/07/13 67.6 kg (149 lb 0.5 oz)  05/15/13 67.586 kg (149 lb)    History of present illness:  Robin TAVANOis a 53y.o. 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  Presented with  Patient have had some cough for the past 3-4 weeks she was diagnosed with minor PNA and was treated with IV antibiotics. She was improving. Today she suddenly developed sharp pains in her back back  similar to the ones she had with prior PE. Patient also, developed sudden onset of shortness of breath she came in to ER hypoxic down to 76% on Room Air and wheezing. She was given continuous nebs for 1 hour with some improvement but remained hypoxic. She has had low grade fever at home and chills. Reports no sick contacts. She have had generalized pains and aches. CXR was worrisome for PNA and hospitalist has been called. Patient is on chronic anticoagulation due to hx of DVT, PE and Lupus anticoagulant. She is NOT on home oxygen.   Hospital Course:   Ms. HMctighepresented with pneumonia with severe sepsis, COPD exacerbation, and persistent hypotension that was determined to be due to adrenal insufficiency, possibly due to repeated courses of steroids.    Persistent hypotension despite fluid resuscitation was likely due to relative adrenal insufficiency.  She was initially admitted to the ICU with SBP in the 70s to 80s which was not fluid responsive.  She received 7 liters of IVF without improvement in BP.  Her antibiotics were escalated from ceftriaxone and azithromycin to vancomycin, cefepime, and azithromycin due to concern for sepsis.  Troponins were negative and ECG was without ischemic changes.  Her cortisol level, despite pneumonia and severity of illness, was only 9.  Patient has been on recurrent doses of steroids this year.  She was started on solumedrol and immediately had improvement in her blood pressure.  Her steroids have gradually been tapered.  Recommend that she taper to 52mof prednisone daily until she follows up with her PCP for formal testing once she has fully recovered.  Acute hypoxic respiratory failure due to acute COPD exac and CAP.  Placed on nasal canula initially, but discharged on room air.    Acute COPD exacerbation was treated with solumedrol and will taper prednisone over two weeks to 67m daily.  She received bronchodilators and antibiotics.    CAP with severe  sepsis, initially treated with ceftriaxone and azithromycin, however, due to persistent hypotension, these were escalated to vancomycin, cefepime, and azithromycin.  When she did not develop spiking fevers and her BP responded quickly to steroids, her antibiotics were tapered back to ceftriaxone + azithromycin.  She completed 1.5gm of azithromycin during her stay and will continue augmentin to complete a 7-day course.   Enlarged mediastinal and hilar LN were likely due to pneumonia, however, radiology recommending repeat CT pending resolution of acute illness to exclude malignancy  Febrile illness, influenza PCR and respiratory viral panel were neg.  Microcytic anemia due to vitamin B12 and iron deficiency.  Started vitamin B12 and ferrous sulfate.  Folate was within normal limits.  Will need GI follow up for iron deficiency anemia.  Chronic pain and high dose narcotics.  Continued fentanyl patch and oxycodone prn and gabapentin.   Lupus anticoagulant with hx of DVT.  Continued coumadin, dosed by pharmacy.  Her INR rose to 7 and she was given one dose of vitamin K 528m  Her INR on the day of discharge is 1.79.  Discussed with pharmacy and recommend taking 2.69m68mhalf of a tab) tonight and then resume 69mg70mily thereafter with INR check in 2 days.    Sick euthyroid, Abnormal TSH (0.154), free T3 and free T4 within normal limits. TSH may be suppressed because of IV steroids.  Repeat thyroid function tests in 4 weeks.     Consultants:  Discussed with PCCM Procedures:  CT angio chest  Antibiotics:  vanc 1/2 >> 1/3 Cefepime 1/2 >> 1/3 azithro 1/2 >> 1/4 Ceftriaxone 1/2 >> 1/5   Discharge Exam: Filed Vitals:   07/09/13 0900  BP: 118/70  Pulse: 75  Temp: 97.4 F (36.3 C)  Resp: 18   Filed Vitals:   07/09/13 0139 07/09/13 0542 07/09/13 0755 07/09/13 0900  BP:  116/69  118/70  Pulse: 93 73  75  Temp:  97.9 F (36.6 C)  97.4 F (36.3 C)  TempSrc:  Oral  Oral  Resp: 18 18  18    Height:      Weight:      SpO2: 96% 100% 98% 96%    General: Thin CF, No acute distress, sitting up and smiling, well-appearing HEENT: NCAT, MMM  Cardiovascular: RRR, nl S1, S2 no mrg, 2+ pulses, warm extremities  Respiratory: Diminished BS right side/bronchial, no wheeze, + rhonchi, stable from prior, no increased WOB  Abdomen: NABS, soft, NT/ND  MSK: Normal tone and bulk, no LEE  Neuro: Grossly intact   Discharge Instructions      Discharge Orders   Future Orders Complete By Expires   Call MD for:  difficulty breathing, headache or visual disturbances  As directed    Call MD for:  extreme fatigue  As directed    Call MD for:  hives  As directed    Call MD for:  persistant dizziness or light-headedness  As directed    Call MD for:  persistant nausea and vomiting  As directed    Call MD for:  severe uncontrolled pain  As directed    Call MD for:  temperature >100.4  As directed  Diet general  As directed    Discharge instructions  As directed    Comments:     You were hospitalized with shortness of breath due to pneumonia and COPD.  You were treated with IV steroids and antibiotics.  Please continue augmentin, first dose starting tomorrow morning, until all tabs are gone.  You were found to have adrenal insufficiency.  Please read through the information about adrenal insufficiency found in this packet.  Please take prednisone in a tapering dose, 5 tabs daily x 2 days, 4 tabs daily x 2 days, 3 tabs daily x 2 days, 2 tabs daily x 2 days, 1 tab daily x 2 days, then 1/2 of a tab daily until you follow up with your primary care doctor and get further instructions.  You were anemic and found to have iron and vitamin B12 deficiency so you have been started on supplements for both.  Please make sure your colonoscopy is up to date and/or talk to your gynecologist if you have had heavy vaginal bleeding.  Please take warfarin 2.54m (half of a tab) this evening, then tomorrow resume your  normal warfarin dose.  Have your INR checked in 2 days.  Finally, you will need a repeat CT scan of your lungs, which can be ordered by your primary care doctor in a few weeks, to make sure your infection is clearing.   Increase activity slowly  As directed        Medication List         acetaminophen 500 MG tablet  Commonly known as:  TYLENOL  Take 500 mg by mouth every 6 (six) hours as needed for pain.     ADVAIR DISKUS 500-50 MCG/DOSE Aepb  Generic drug:  Fluticasone-Salmeterol  Inhale 1 puff into the lungs 2 (two) times daily.     albuterol 108 (90 BASE) MCG/ACT inhaler  Commonly known as:  PROVENTIL HFA;VENTOLIN HFA  Inhale 2 puffs into the lungs every 4 (four) hours as needed for wheezing or shortness of breath.     amoxicillin-clavulanate 875-125 MG per tablet  Commonly known as:  AUGMENTIN  Take 1 tablet by mouth 2 (two) times daily.     benzonatate 200 MG capsule  Commonly known as:  TESSALON  Take 1 capsule (200 mg total) by mouth 3 (three) times daily.     cyanocobalamin 1000 MCG tablet  Take 1 tablet (1,000 mcg total) by mouth daily.     DULoxetine 60 MG capsule  Commonly known as:  CYMBALTA  Take 60 mg by mouth daily.     feeding supplement (ENSURE COMPLETE) Liqd  Take 237 mLs by mouth 2 (two) times daily between meals.     fentaNYL 50 MCG/HR  Commonly known as:  DURAGESIC - dosed mcg/hr  Place 1 patch onto the skin every 3 (three) days.     ferrous sulfate 325 (65 FE) MG tablet  Take 1 tablet (325 mg total) by mouth 3 (three) times daily with meals.     gabapentin 600 MG tablet  Commonly known as:  NEURONTIN  Take 600-1,200 mg by mouth 3 (three) times daily. Take 6039min the morning, 60058mn the afternoon and 1200m57m bedtime     guaiFENesin 600 MG 12 hr tablet  Commonly known as:  MUCINEX  Take 600 mg by mouth 2 (two) times daily as needed for cough.     ipratropium-albuterol 0.5-2.5 (3) MG/3ML Soln  Commonly known as:  DUONEB  Inhale 3 mLs  into the lungs every 4 (four) hours as needed (wheezing).     oxyCODONE 15 MG immediate release tablet  Commonly known as:  ROXICODONE  Take 15 mg by mouth every 6 (six) hours as needed for pain.     predniSONE 10 MG tablet  Commonly known as:  DELTASONE  Take 5 tabs daily x 2 days, 4 tabs x 2 days, 3 tabs daily x 2 days, 2 tabs daily x 2 days, 1 tab daily x 2 days, then half tab daily.     Vitamin D (Ergocalciferol) 50000 UNITS Caps capsule  Commonly known as:  DRISDOL  Take 50,000 Units by mouth every Friday.     warfarin 5 MG tablet  Commonly known as:  COUMADIN  Take 5 mg by mouth at bedtime.       Follow-up Information   Follow up with Paruchuri, Saunders Glance, MD. Schedule an appointment as soon as possible for a visit in 2 weeks.   Specialty:  Internal Medicine   Contact information:   [place] [address] [phone]        The results of significant diagnostics from this hospitalization (including imaging, microbiology, ancillary and laboratory) are listed below for reference.    Significant Diagnostic Studies: Dg Chest 2 View  07/05/2013   CLINICAL DATA:  Cough and shortness of Breath.  EXAM: CHEST  2 VIEW  COMPARISON:  05/25/2013  FINDINGS: The cardiac silhouette, mediastinal and hilar contours are normal and stable. The lungs demonstrate stable hyperinflation and chronic bronchitic changes. There is a patchy right middle lobe infiltrate. The left lung is clear. The bony thorax is intact. Stable enchondroma in the humeral metaphysis  IMPRESSION: Right middle lobe infiltrate.   Electronically Signed   By: Kalman Jewels M.D.   On: 07/05/2013 18:51   Ct Angio Chest Pe W/cm &/or Wo Cm  07/06/2013   CLINICAL DATA:  Shortness of breath. Productive cough. Chest pain. White cell count 12.7.  EXAM: CT ANGIOGRAPHY CHEST WITH CONTRAST  TECHNIQUE: Multidetector CT imaging of the chest was performed using the standard protocol during bolus administration of  intravenous contrast. Multiplanar CT image reconstructions including MIPs were obtained to evaluate the vascular anatomy.  CONTRAST:  144m OMNIPAQUE IOHEXOL 350 MG/ML SOLN  COMPARISON:  01/05/2010  FINDINGS: Technically adequate study with good opacification of the central and segmental pulmonary arteries. No focal filling defects demonstrated. No evidence of significant pulmonary embolus.  Normal heart size. Normal caliber thoracic aorta. No dissection. Prominent mediastinal and right hilar lymph nodes are demonstrated, measuring up to about 1.8 cm Thoren Hosang axis dimension. These are demonstrating some progression since previous study. These are likely to be reactive although followup is recommended to exclude lymphoproliferative process. Esophagus is decompressed.  There is diffuse patchy nodular infiltration in the lungs with tree-in-bud pattern consistent with bronchiolitis. Focal consolidation seen in the right middle lung. No pneumothorax. Diffuse airways thickening. Airways are patent. No pleural effusions. Probable postoperative changes in the upper stomach.  Review of the MIP images confirms the above findings.  IMPRESSION: No evidence of significant pulmonary embolus. Diffuse bilateral tree-in-bud parenchymal pattern to the lungs suggesting bronchiolitis with focal consolidation in the right middle lung. Right hilar and mediastinal lymph nodes are enlarged, likely reactive given the inflammatory process. Follow-up recommended to exclude underlying neoplastic change.   Electronically Signed   By: WLucienne CapersM.D.   On: 07/06/2013 04:39    Microbiology: Recent Results (from the past 240 hour(s))  CULTURE, BLOOD (ROUTINE X 2)     Status: None   Collection Time    07/06/13  3:35 AM      Result Value Range Status   Specimen Description BLOOD RIGHT FOREARM   Final   Special Requests BOTTLES DRAWN AEROBIC AND ANAEROBIC 5CC   Final   Culture  Setup Time     Final   Value: 07/06/2013 09:03      Performed at [facility]   Culture     Final   Value:        BLOOD CULTURE RECEIVED NO GROWTH TO DATE CULTURE WILL BE HELD FOR 5 DAYS BEFORE ISSUING A FINAL NEGATIVE REPORT     Performed at [facility]   Report Status PENDING   Incomplete  CULTURE, BLOOD (ROUTINE X 2)     Status: None   Collection Time    07/06/13  4:25 AM      Result Value Range Status   Specimen Description BLOOD RIGHT HAND   Final   Special Requests BOTTLES DRAWN AEROBIC AND ANAEROBIC 4CC   Final   Culture  Setup Time     Final   Value: 07/06/2013 09:03     Performed at [facility]   Culture     Final   Value:        BLOOD CULTURE RECEIVED NO GROWTH TO DATE CULTURE WILL BE HELD FOR 5 DAYS BEFORE ISSUING A FINAL NEGATIVE REPORT     Performed at [facility]   Report Status PENDING   Incomplete  URINE CULTURE     Status: None   Collection Time    07/06/13  6:35 AM      Result Value Range Status   Specimen Description URINE, RANDOM   Final   Special Requests NONE   Final   Culture  Setup Time     Final   Value: 07/06/2013 12:55     Performed at [facility]     Final   Value: 30,000 COLONIES/ML     Performed at [facility]   Culture     Final   Value: Multiple bacterial morphotypes present, none predominant. Suggest appropriate recollection if clinically indicated.     Performed at [facility]   Report Status 07/07/2013 FINAL   Final  RESPIRATORY VIRUS PANEL     Status: None   Collection Time    07/06/13  6:52 AM      Result Value Range Status   Source - RVPAN NASOPHARYNGEAL   Final   Respiratory Syncytial Virus A NOT DETECTED   Final   Respiratory Syncytial Virus B NOT DETECTED   Final   Influenza A NOT DETECTED   Final   Influenza B NOT DETECTED   Final   Parainfluenza 1 NOT DETECTED   Final   Parainfluenza 2 NOT DETECTED   Final   Parainfluenza 3 NOT DETECTED   Final   Metapneumovirus NOT DETECTED   Final   Rhinovirus NOT  DETECTED   Final   Adenovirus NOT DETECTED   Final   Influenza A H1 NOT DETECTED   Final   Influenza A H3 NOT DETECTED   Final   Comment: (NOTE)           Normal Reference Range for each Analyte: NOT DETECTED     Testing performed using the Luminex xTAG Respiratory Viral Panel test     kit.  This test was developed and its performance characteristics determined     by [facility]. It has not been cleared or approved by the [place]     Food and Drug Administration. This test is used for clinical purposes.     It should not be regarded as investigational or for research. This     laboratory is certified under the [place] (CLIA) as qualified to perform high complexity     clinical laboratory testing.     Performed at [facility] PCR SCREENING     Status: None   Collection Time    07/06/13  6:58 AM      Result Value Range Status   MRSA by PCR NEGATIVE  NEGATIVE Final   Comment:            The GeneXpert MRSA Assay (FDA     approved for NASAL specimens     only), is one component of a     comprehensive MRSA colonization     surveillance program. It is not     intended to diagnose MRSA     infection nor to guide or     monitor treatment for     MRSA infections.     Labs: Basic Metabolic Panel:  Recent Labs Lab 07/05/13 1900 07/06/13 0622 07/07/13 0425 07/08/13 0548 07/09/13 0530  NA 133* 138 143 143 143  K 4.2 3.5* 4.6 3.5* 3.2*  CL 95* 101 110 109 108  CO2 25 25 18* 22 24  GLUCOSE 99 106* 137* 138* 96  BUN 6 5* <3* 6 8  CREATININE 0.50 0.41* 0.35* 0.38* 0.44*  CALCIUM 9.1 8.3* 8.9 9.0 8.8  MG  --   --  1.8  --   --   PHOS  --   --  3.2  --   --    Liver Function Tests:  Recent Labs Lab 07/06/13 0622  AST 10  ALT 5  ALKPHOS 84  BILITOT 0.3  PROT 6.1  ALBUMIN 2.6*   No results found for this basename: LIPASE, AMYLASE,  in the last 168 hours No results found for this basename: AMMONIA,  in  the last 168 hours CBC:  Recent Labs Lab 07/05/13 1900 07/06/13 1235 07/08/13 0548 07/09/13 0530  WBC 12.7* 8.2 11.1* 9.0  NEUTROABS 9.7*  --   --   --   HGB 9.9* 8.2* 8.4* 8.5*  HCT 32.7* 26.9* 27.0* 28.1*  MCV 75.5* 76.4* 75.2* 76.8*  PLT 420* 296 415* 372   Cardiac Enzymes:  Recent Labs Lab 07/06/13 0255 07/06/13 0920 07/06/13 1235  TROPONINI <0.30 <0.30 <0.30   BNP: BNP (last 3 results) No results found for this basename: PROBNP,  in the last 8760 hours CBG: No results found for this basename: GLUCAP,  in the last 168 hours  Time coordinating discharge: 45 minutes  Signed:  Jenniger Figiel  [facility] 07/09/2013, 12:59 PM

## 2013-07-09 NOTE — Progress Notes (Signed)
ANTICOAGULATION CONSULT NOTE - Follow Up Consult  Pharmacy Consult for Warfarin Indication: Hx DVT/PE  No Known Allergies  Patient Measurements: Height: 5\' 8"  (172.7 cm) Weight: 149 lb 0.5 oz (67.6 kg) IBW/kg (Calculated) : 63.9  Vital Signs: Temp: 97.4 F (36.3 C) (01/05 0900) Temp src: Oral (01/05 0900) BP: 118/70 mmHg (01/05 0900) Pulse Rate: 75 (01/05 0900)  Labs:  Recent Labs  07/06/13 1235 07/07/13 0425 07/08/13 0548 07/09/13 0530  HGB 8.2*  --  8.4* 8.5*  HCT 26.9*  --  27.0* 28.1*  PLT 296  --  415* 372  LABPROT  --  40.0* 58.2* 20.3*  INR  --  4.36* 7.12* 1.79*  CREATININE  --  0.35* 0.38* 0.44*  TROPONINI <0.30  --   --   --     Estimated Creatinine Clearance: 83.9 ml/min (by C-G formula based on Cr of 0.44).   Assessment: [age] yoF admitted with recurrent pneumonia on chronic warfarin PTA for history of DVT/PE and lupus anticoagulant disorder. No PE per CTA on 1/1.  PTA dose is 5 mg daily and LD reported at 12/31 PM.    No dose taken 1/1 but given 5mg  at 10am and 2.5mg  at 18:00 on 1/2  INR now subtherapeutic at 1.79 following vitamin k 5mg  PO given per MD.  INR was 7.12 yesterday.  CBC low but stable.  No bleeding reported.  Potential drug-drug interaction with steroids and antibiotics (zithromax, d/c'd 1/4) and increased warfarin sensitivity with little PO intake  Goal of Therapy:  INR 2-3 Monitor platelets by anticoagulation protocol: Yes   Plan:  1.  Resume warfarin with 2.5 mg dose today.  Will be cautious with dosing given recent supratherapeutic level, low PO intake, and drug interactions (steroid). 2.  F/u INR in AM.  Clance BollAmanda Aryam Zhan, PharmD, BCPS Pager: [phone] 07/09/2013 11:42 AM

## 2013-07-09 NOTE — Care Management Note (Signed)
    Page 1 of 1   07/09/2013     1:55:55 PM   CARE MANAGEMENT NOTE 07/09/2013  Patient:  Robin Prince,Robin Prince   Account Number:  [id number]  Date Initiated:  07/06/2013  Documentation initiated by:  DAVIS,RHONDA  Subjective/Objective Assessment:   pna versus copd excerbation     Action/Plan:   home   Anticipated DC Date:  07/09/2013   Anticipated DC Plan:        DC Planning Services  CM consult      Choice offered to / List presented to:             Status of service:  Completed, signed off Medicare Important Message given?  NO (If response is "NO", the following Medicare IM given date fields will be blank) Date Medicare IM given:   Date Additional Medicare IM given:    Discharge Disposition:  HOME/SELF CARE  Per UR Regulation:  Reviewed for med. necessity/level of care/duration of stay  If discussed at Long Length of Stay Meetings, dates discussed:    Comments:  07/09/13 [name] RN,BSN NCM [phone] D/C HOME NO ORDERS OR NEEDS.  [id number]/Rhonda Earlene Plateravis, RN, BSN, [place] [phone] Chart Reviewed for discharge and hospital needs. Discharge needs at time of review:  None present will follow for needs. Review of patient progress due on [id number].

## 2013-07-12 LAB — CULTURE, BLOOD (ROUTINE X 2)
CULTURE: NO GROWTH
Culture: NO GROWTH

## 2013-07-26 ENCOUNTER — Inpatient Hospital Stay (HOSPITAL_COMMUNITY)
Admission: EM | Admit: 2013-07-26 | Discharge: 2013-07-30 | DRG: 193 | Disposition: A | Discharge status: Home / Self Care | Payer: Managed Care, Other (non HMO) | Attending: Internal Medicine | Admitting: Internal Medicine

## 2013-07-26 ENCOUNTER — Encounter (HOSPITAL_COMMUNITY): Payer: Self-pay | Admitting: Emergency Medicine

## 2013-07-26 ENCOUNTER — Emergency Department (HOSPITAL_COMMUNITY): Payer: Managed Care, Other (non HMO)

## 2013-07-26 DIAGNOSIS — IMO0002 Reserved for concepts with insufficient information to code with codable children: Secondary | ICD-10-CM

## 2013-07-26 DIAGNOSIS — D6859 Other primary thrombophilia: Secondary | ICD-10-CM | POA: Diagnosis present

## 2013-07-26 DIAGNOSIS — D638 Anemia in other chronic diseases classified elsewhere: Secondary | ICD-10-CM | POA: Diagnosis present

## 2013-07-26 DIAGNOSIS — Z86718 Personal history of other venous thrombosis and embolism: Secondary | ICD-10-CM

## 2013-07-26 DIAGNOSIS — I82509 Chronic embolism and thrombosis of unspecified deep veins of unspecified lower extremity: Secondary | ICD-10-CM | POA: Diagnosis present

## 2013-07-26 DIAGNOSIS — J9601 Acute respiratory failure with hypoxia: Secondary | ICD-10-CM | POA: Diagnosis present

## 2013-07-26 DIAGNOSIS — J189 Pneumonia, unspecified organism: Secondary | ICD-10-CM | POA: Diagnosis present

## 2013-07-26 DIAGNOSIS — J441 Chronic obstructive pulmonary disease with (acute) exacerbation: Secondary | ICD-10-CM | POA: Diagnosis present

## 2013-07-26 DIAGNOSIS — E2749 Other adrenocortical insufficiency: Secondary | ICD-10-CM | POA: Diagnosis present

## 2013-07-26 DIAGNOSIS — R791 Abnormal coagulation profile: Secondary | ICD-10-CM | POA: Diagnosis present

## 2013-07-26 DIAGNOSIS — F17200 Nicotine dependence, unspecified, uncomplicated: Secondary | ICD-10-CM | POA: Diagnosis present

## 2013-07-26 DIAGNOSIS — N179 Acute kidney failure, unspecified: Secondary | ICD-10-CM | POA: Diagnosis present

## 2013-07-26 DIAGNOSIS — Z7901 Long term (current) use of anticoagulants: Secondary | ICD-10-CM

## 2013-07-26 DIAGNOSIS — D518 Other vitamin B12 deficiency anemias: Secondary | ICD-10-CM

## 2013-07-26 DIAGNOSIS — E2740 Unspecified adrenocortical insufficiency: Secondary | ICD-10-CM | POA: Diagnosis present

## 2013-07-26 DIAGNOSIS — D6862 Lupus anticoagulant syndrome: Secondary | ICD-10-CM | POA: Diagnosis present

## 2013-07-26 DIAGNOSIS — Z823 Family history of stroke: Secondary | ICD-10-CM

## 2013-07-26 DIAGNOSIS — G5632 Lesion of radial nerve, left upper limb: Secondary | ICD-10-CM

## 2013-07-26 DIAGNOSIS — J13 Pneumonia due to Streptococcus pneumoniae: Principal | ICD-10-CM | POA: Diagnosis present

## 2013-07-26 DIAGNOSIS — Z79899 Other long term (current) drug therapy: Secondary | ICD-10-CM

## 2013-07-26 DIAGNOSIS — J9600 Acute respiratory failure, unspecified whether with hypoxia or hypercapnia: Secondary | ICD-10-CM | POA: Diagnosis present

## 2013-07-26 LAB — CBC WITH DIFFERENTIAL/PLATELET
BASOS PCT: 0 % (ref 0–1)
Basophils Absolute: 0 10*3/uL (ref 0.0–0.1)
Basophils Absolute: 0 10*3/uL (ref 0.0–0.1)
Basophils Relative: 0 % (ref 0–1)
EOS PCT: 0 % (ref 0–5)
Eosinophils Absolute: 0 10*3/uL (ref 0.0–0.7)
Eosinophils Absolute: 0 10*3/uL (ref 0.0–0.7)
Eosinophils Relative: 0 % (ref 0–5)
HEMATOCRIT: 31.4 % — AB (ref 36.0–46.0)
HEMATOCRIT: 30.8 % — AB (ref 36.0–46.0)
HEMOGLOBIN: 9.5 g/dL — AB (ref 12.0–15.0)
Hemoglobin: 9.8 g/dL — ABNORMAL LOW (ref 12.0–15.0)
LYMPHS ABS: 0.8 10*3/uL (ref 0.7–4.0)
LYMPHS PCT: 8 % — AB (ref 12–46)
Lymphocytes Relative: 5 % — ABNORMAL LOW (ref 12–46)
Lymphs Abs: 0.8 10*3/uL (ref 0.7–4.0)
MCH: 24.5 pg — ABNORMAL LOW (ref 26.0–34.0)
MCH: 24.5 pg — ABNORMAL LOW (ref 26.0–34.0)
MCHC: 31.2 g/dL (ref 30.0–36.0)
MCHC: 30.8 g/dL (ref 30.0–36.0)
MCV: 78.5 fL (ref 78.0–100.0)
MCV: 79.6 fL (ref 78.0–100.0)
MONOS PCT: 1 % — AB (ref 3–12)
MONOS PCT: 1 % — AB (ref 3–12)
Monocytes Absolute: 0.2 10*3/uL (ref 0.1–1.0)
Monocytes Absolute: 0.1 10*3/uL (ref 0.1–1.0)
NEUTROS ABS: 14 10*3/uL — AB (ref 1.7–7.7)
NEUTROS ABS: 9 10*3/uL — AB (ref 1.7–7.7)
Neutrophils Relative %: 94 % — ABNORMAL HIGH (ref 43–77)
Neutrophils Relative %: 91 % — ABNORMAL HIGH (ref 43–77)
Platelets: 390 10*3/uL (ref 150–400)
Platelets: 380 10*3/uL (ref 150–400)
RBC: 4 MIL/uL (ref 3.87–5.11)
RBC: 3.87 MIL/uL (ref 3.87–5.11)
RDW: 22.2 % — ABNORMAL HIGH (ref 11.5–15.5)
RDW: 22.9 % — ABNORMAL HIGH (ref 11.5–15.5)
WBC: 15 10*3/uL — AB (ref 4.0–10.5)
WBC: 9.9 10*3/uL (ref 4.0–10.5)

## 2013-07-26 LAB — COMPREHENSIVE METABOLIC PANEL
ALBUMIN: 2.6 g/dL — AB (ref 3.5–5.2)
ALK PHOS: 95 U/L (ref 39–117)
ALT: 8 U/L (ref 0–35)
ALT: 7 U/L (ref 0–35)
AST: 12 U/L (ref 0–37)
AST: 10 U/L (ref 0–37)
Albumin: 2.8 g/dL — ABNORMAL LOW (ref 3.5–5.2)
Alkaline Phosphatase: 94 U/L (ref 39–117)
BUN: 7 mg/dL (ref 6–23)
BUN: 6 mg/dL (ref 6–23)
CALCIUM: 8.6 mg/dL (ref 8.4–10.5)
CHLORIDE: 98 meq/L (ref 96–112)
CHLORIDE: 101 meq/L (ref 96–112)
CO2: 25 mEq/L (ref 19–32)
CO2: 24 mEq/L (ref 19–32)
Calcium: 8.9 mg/dL (ref 8.4–10.5)
Creatinine, Ser: 0.39 mg/dL — ABNORMAL LOW (ref 0.50–1.10)
Creatinine, Ser: 0.38 mg/dL — ABNORMAL LOW (ref 0.50–1.10)
GFR calc Af Amer: >90 mL/min (ref >90)
GFR calc non Af Amer: >90 mL/min (ref >90)
Glucose, Bld: 149 mg/dL — ABNORMAL HIGH (ref 70–99)
Glucose, Bld: 150 mg/dL — ABNORMAL HIGH (ref 70–99)
POTASSIUM: 3.9 meq/L (ref 3.7–5.3)
Potassium: 3.9 mEq/L (ref 3.7–5.3)
SODIUM: 138 meq/L (ref 137–147)
Sodium: 136 mEq/L — ABNORMAL LOW (ref 137–147)
Total Bilirubin: 0.3 mg/dL (ref 0.3–1.2)
Total Bilirubin: 0.3 mg/dL (ref 0.3–1.2)
Total Protein: 7.5 g/dL (ref 6.0–8.3)
Total Protein: 7.1 g/dL (ref 6.0–8.3)

## 2013-07-26 LAB — MAGNESIUM: Magnesium: 1.7 mg/dL (ref 1.5–2.5)

## 2013-07-26 LAB — PROTIME-INR
INR: 3.33 — ABNORMAL HIGH (ref 0.00–1.49)
INR: 2.91 — AB (ref 0.00–1.49)
PROTHROMBIN TIME: 32.6 s — AB (ref 11.6–15.2)
PROTHROMBIN TIME: 29.4 s — AB (ref 11.6–15.2)

## 2013-07-26 LAB — APTT: APTT: 74 s — AB (ref 24–37)

## 2013-07-26 LAB — TSH: TSH: 0.172 u[IU]/mL — AB (ref 0.350–4.500)

## 2013-07-26 LAB — PHOSPHORUS: Phosphorus: 3.3 mg/dL (ref 2.3–4.6)

## 2013-07-26 MED ORDER — FENTANYL 50 MCG/HR TD PT72
50.0000 ug | MEDICATED_PATCH | TRANSDERMAL | Status: DC
  Administered 2013-07-26 – 2013-07-29 (×2): 50 ug via TRANSDERMAL
  Filled 2013-07-26 (×2): qty 1

## 2013-07-26 MED ORDER — VITAMIN B-12 1000 MCG PO TABS
1000.0000 ug | ORAL_TABLET | Freq: Every day | ORAL | Status: DC
  Administered 2013-07-26 – 2013-07-30 (×5): 1000 ug via ORAL
  Filled 2013-07-26 (×5): qty 1

## 2013-07-26 MED ORDER — ONDANSETRON HCL 4 MG/2ML IJ SOLN: 4.0000 mg | Freq: Four times a day (QID) | INTRAMUSCULAR | Status: DC | PRN

## 2013-07-26 MED ORDER — GUAIFENESIN ER 600 MG PO TB12
600.0000 mg | ORAL_TABLET | Freq: Two times a day (BID) | ORAL | Status: DC | PRN
  Administered 2013-07-29: 600 mg via ORAL
  Filled 2013-07-26: qty 1

## 2013-07-26 MED ORDER — MORPHINE SULFATE 2 MG/ML IJ SOLN: 1.0000 mg | INTRAMUSCULAR | Status: DC | PRN

## 2013-07-26 MED ORDER — PIPERACILLIN-TAZOBACTAM 3.375 G IVPB
3.3750 g | INTRAVENOUS | Status: AC
  Administered 2013-07-26: 3.375 g via INTRAVENOUS
  Filled 2013-07-26: qty 50

## 2013-07-26 MED ORDER — VANCOMYCIN HCL 10 G IV SOLR
1250.0000 mg | INTRAVENOUS | Status: AC
  Administered 2013-07-26: 1250 mg via INTRAVENOUS
  Filled 2013-07-26: qty 1250

## 2013-07-26 MED ORDER — HYDROMORPHONE HCL PF 1 MG/ML IJ SOLN
1.0000 mg | INTRAMUSCULAR | Status: DC | PRN
  Administered 2013-07-26 – 2013-07-29 (×11): 1 mg via INTRAVENOUS
  Filled 2013-07-26 (×11): qty 1

## 2013-07-26 MED ORDER — DEXTROSE 5 % IV SOLN
1.0000 g | Freq: Three times a day (TID) | INTRAVENOUS | Status: DC
  Administered 2013-07-26 – 2013-07-30 (×11): 1 g via INTRAVENOUS
  Filled 2013-07-26 (×13): qty 1

## 2013-07-26 MED ORDER — METHYLPREDNISOLONE SODIUM SUCC 125 MG IJ SOLR
60.0000 mg | Freq: Two times a day (BID) | INTRAMUSCULAR | Status: DC
  Administered 2013-07-26 – 2013-07-28 (×4): 60 mg via INTRAVENOUS
  Filled 2013-07-26 (×6): qty 0.96

## 2013-07-26 MED ORDER — SODIUM CHLORIDE 0.9 % IV BOLUS (SEPSIS)
1000.0000 mL | Freq: Once | INTRAVENOUS | Status: AC
  Administered 2013-07-26: 1000 mL via INTRAVENOUS

## 2013-07-26 MED ORDER — SODIUM CHLORIDE 0.9 % IJ SOLN
3.0000 mL | Freq: Two times a day (BID) | INTRAMUSCULAR | Status: DC
  Administered 2013-07-27 – 2013-07-30 (×5): 3 mL via INTRAVENOUS

## 2013-07-26 MED ORDER — PREDNISONE 20 MG PO TABS
60.0000 mg | ORAL_TABLET | Freq: Once | ORAL | Status: AC
  Administered 2013-07-26: 60 mg via ORAL
  Filled 2013-07-26: qty 3

## 2013-07-26 MED ORDER — FERROUS SULFATE 325 (65 FE) MG PO TABS
325.0000 mg | ORAL_TABLET | Freq: Three times a day (TID) | ORAL | Status: DC
  Administered 2013-07-26 – 2013-07-30 (×12): 325 mg via ORAL
  Filled 2013-07-26 (×15): qty 1

## 2013-07-26 MED ORDER — WARFARIN - PHARMACIST DOSING INPATIENT: Freq: Every day | Status: DC

## 2013-07-26 MED ORDER — VITAMIN D (ERGOCALCIFEROL) 1.25 MG (50000 UNIT) PO CAPS
50000.0000 [IU] | ORAL_CAPSULE | ORAL | Status: DC
  Administered 2013-07-27: 50000 [IU] via ORAL
  Filled 2013-07-26: qty 1

## 2013-07-26 MED ORDER — ALBUTEROL SULFATE (2.5 MG/3ML) 0.083% IN NEBU: 2.5000 mg | INHALATION_SOLUTION | Freq: Four times a day (QID) | RESPIRATORY_TRACT | Status: DC

## 2013-07-26 MED ORDER — DULOXETINE HCL 60 MG PO CPEP
60.0000 mg | ORAL_CAPSULE | Freq: Every day | ORAL | Status: DC
  Administered 2013-07-26 – 2013-07-30 (×5): 60 mg via ORAL
  Filled 2013-07-26 (×5): qty 1

## 2013-07-26 MED ORDER — ONDANSETRON HCL 4 MG PO TABS
4.0000 mg | ORAL_TABLET | Freq: Four times a day (QID) | ORAL | Status: DC | PRN
Start: 2013-07-26 — End: 2013-07-30

## 2013-07-26 MED ORDER — GABAPENTIN 300 MG PO CAPS
600.0000 mg | ORAL_CAPSULE | Freq: Two times a day (BID) | ORAL | Status: DC
  Administered 2013-07-26 – 2013-07-30 (×8): 600 mg via ORAL
  Filled 2013-07-26 (×9): qty 2

## 2013-07-26 MED ORDER — IPRATROPIUM-ALBUTEROL 0.5-2.5 (3) MG/3ML IN SOLN
3.0000 mL | Freq: Once | RESPIRATORY_TRACT | Status: AC
  Administered 2013-07-26: 3 mL via RESPIRATORY_TRACT
  Filled 2013-07-26: qty 3

## 2013-07-26 MED ORDER — ACETAMINOPHEN 650 MG RE SUPP: 650.0000 mg | Freq: Four times a day (QID) | RECTAL | Status: DC | PRN

## 2013-07-26 MED ORDER — ACETAMINOPHEN 325 MG PO TABS
650.0000 mg | ORAL_TABLET | Freq: Four times a day (QID) | ORAL | Status: DC | PRN
  Administered 2013-07-28 – 2013-07-29 (×2): 650 mg via ORAL
  Filled 2013-07-26 (×2): qty 2

## 2013-07-26 MED ORDER — ENSURE COMPLETE PO LIQD
237.0000 mL | Freq: Two times a day (BID) | ORAL | Status: DC
  Administered 2013-07-27 – 2013-07-30 (×4): 237 mL via ORAL

## 2013-07-26 MED ORDER — IPRATROPIUM-ALBUTEROL 0.5-2.5 (3) MG/3ML IN SOLN
3.0000 mL | Freq: Four times a day (QID) | RESPIRATORY_TRACT | Status: DC
  Administered 2013-07-26 – 2013-07-30 (×14): 3 mL via RESPIRATORY_TRACT
  Filled 2013-07-26 (×16): qty 3

## 2013-07-26 MED ORDER — IPRATROPIUM BROMIDE 0.02 % IN SOLN: 0.5000 mg | Freq: Four times a day (QID) | RESPIRATORY_TRACT | Status: DC

## 2013-07-26 MED ORDER — IPRATROPIUM-ALBUTEROL 0.5-2.5 (3) MG/3ML IN SOLN: 3.0000 mL | RESPIRATORY_TRACT | Status: DC | PRN

## 2013-07-26 MED ORDER — SODIUM CHLORIDE 0.9 % IV SOLN
INTRAVENOUS | Status: DC
  Administered 2013-07-26: 16:00:00 via INTRAVENOUS

## 2013-07-26 MED ORDER — GABAPENTIN 400 MG PO CAPS
1200.0000 mg | ORAL_CAPSULE | Freq: Every day | ORAL | Status: DC
  Administered 2013-07-26 – 2013-07-29 (×4): 1200 mg via ORAL
  Filled 2013-07-26 (×5): qty 3

## 2013-07-26 MED ORDER — SODIUM CHLORIDE 0.9 % IV SOLN
INTRAVENOUS | Status: AC
  Administered 2013-07-26: 75 mL/h via INTRAVENOUS

## 2013-07-26 MED ORDER — VANCOMYCIN HCL IN DEXTROSE 750-5 MG/150ML-% IV SOLN
750.0000 mg | Freq: Two times a day (BID) | INTRAVENOUS | Status: DC
  Administered 2013-07-27 – 2013-07-28 (×3): 750 mg via INTRAVENOUS
  Filled 2013-07-26 (×4): qty 150

## 2013-07-26 MED ORDER — ALBUTEROL SULFATE (2.5 MG/3ML) 0.083% IN NEBU: 2.5000 mg | INHALATION_SOLUTION | RESPIRATORY_TRACT | Status: DC | PRN

## 2013-07-26 MED ORDER — OXYCODONE HCL 5 MG PO TABS
15.0000 mg | ORAL_TABLET | Freq: Four times a day (QID) | ORAL | Status: DC | PRN
  Administered 2013-07-28: 15 mg via ORAL
  Filled 2013-07-26 (×2): qty 3

## 2013-07-26 MED ORDER — BENZONATATE 100 MG PO CAPS
200.0000 mg | ORAL_CAPSULE | Freq: Three times a day (TID) | ORAL | Status: DC
  Administered 2013-07-26 – 2013-07-30 (×12): 200 mg via ORAL
  Filled 2013-07-26 (×15): qty 2

## 2013-07-26 NOTE — ED Provider Notes (Signed)
CSN: [id number]     Arrival date & time 07/26/13  1254 History   First MD Initiated Contact with Patient 07/26/13 1324     Chief Complaint  Patient presents with  . 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 Treated with vanc, cefipime, and zithromax during hospitalization and discharged home on tapering dose of prednisone, albuterol nebs and 1 week course of augmentin which she completed. Has not been using the nebs at home on regular basis and is down to prednisone 5mg  daily for past several days. Referred by PCP for concerns of worsening persistent pneumonia as had repeat CT chest on 07/20/13 that revealed persistent pneumonia. Patient reports "good days and bad days too". Denies N/V/D, abd pain, chest pain.  Patient is a 53 y.o. female presenting with pneumonia. The history is provided by the patient.  Pneumonia Associated symptoms include chills, coughing, diaphoresis, fatigue and a fever. Pertinent negatives include no abdominal pain, headaches, nausea, rash, sore throat or vomiting.    Past Medical History  Diagnosis Date  . DVT (deep venous thrombosis)   . COPD (chronic obstructive pulmonary disease)   . Bronchitis   . 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 Lupus anticoagulant disorder   . Osteoporosis   . Chronic pain syndrome     Phantom leg pain, right  . History of pleural effusion     Status post sclerotherapy  . Chronic anticoagulation    Past Surgical History  Procedure  Laterality Date  . Leg amputation below knee Right   . Cholecystectomy    . Tonsillectomy    . Gastric bypass     Family History  Problem Relation Age of Onset  . Cancer Father   . Migraines Sister   . Stroke Sister    History  Substance Use Topics  . Smoking status: Current Every Day Smoker -- 0.50 packs/day    Types: Cigarettes  . Smokeless tobacco: Never Used  . Alcohol Use: No   OB History   Grav Para Term Preterm Abortions TAB SAB Ect Mult Living                 Review of Systems  Constitutional: Positive for fever, chills, diaphoresis and fatigue.  HENT: Negative for rhinorrhea and sore throat.   Eyes: Negative for visual disturbance.  Respiratory: Positive for cough, shortness of breath and wheezing.   Gastrointestinal: Negative for nausea, vomiting and abdominal pain.  Genitourinary: Negative for difficulty urinating.  Musculoskeletal: Negative for back pain.  Skin: Negative for rash.  Neurological: Negative for dizziness and headaches.  Hematological: Negative for adenopathy.  Psychiatric/Behavioral: Negative for agitation.    Allergies  Review of patient's allergies indicates no known allergies.  Home Medications   Current Outpatient Rx  Name  Route  Sig  Dispense  Refill  . acetaminophen (TYLENOL) 500 MG tablet   Oral   Take 500 mg by mouth every 6 (six) hours as needed for pain.         Marland Kitchen ADVAIR DISKUS 500-50 MCG/DOSE AEPB   Inhalation   Inhale 1 puff into the lungs 2 (two) times daily.          Marland Kitchen  albuterol (PROVENTIL HFA;VENTOLIN HFA) 108 (90 BASE) MCG/ACT inhaler   Inhalation   Inhale 2 puffs into the lungs every 4 (four) hours as needed for wheezing or shortness of breath.   1 Inhaler   3   . amoxicillin-clavulanate (AUGMENTIN) 875-125 MG per tablet   Oral   Take 1 tablet by mouth 2 (two) times daily.   12 tablet   0   . benzonatate (TESSALON) 200 MG capsule   Oral   Take 1 capsule (200 mg total) by mouth 3 (three) times daily.    20 capsule   0   . DULoxetine (CYMBALTA) 60 MG capsule   Oral   Take 60 mg by mouth daily.          . feeding supplement, ENSURE COMPLETE, (ENSURE COMPLETE) LIQD   Oral   Take 237 mLs by mouth 2 (two) times daily between meals.   60 Bottle   0   . fentaNYL (DURAGESIC - DOSED MCG/HR) 50 MCG/HR   Transdermal   Place 1 patch onto the skin every 3 (three) days.          . ferrous sulfate 325 (65 FE) MG tablet   Oral   Take 1 tablet (325 mg total) by mouth 3 (three) times daily with meals.   90 tablet   0   . gabapentin (NEURONTIN) 600 MG tablet   Oral   Take 600-1,200 mg by mouth 3 (three) times daily. Take 600mg  in the morning, 600mg  in the afternoon and 1200mg  at bedtime         . guaiFENesin (MUCINEX) 600 MG 12 hr tablet   Oral   Take 600 mg by mouth 2 (two) times daily as needed for cough.         Marland Kitchen. ipratropium-albuterol (DUONEB) 0.5-2.5 (3) MG/3ML SOLN   Inhalation   Inhale 3 mLs into the lungs every 4 (four) hours as needed (wheezing).          Marland Kitchen. oxyCODONE (ROXICODONE) 15 MG immediate release tablet   Oral   Take 15 mg by mouth every 6 (six) hours as needed for pain.          . predniSONE (DELTASONE) 10 MG tablet      Take 5 tabs daily x 2 days, 4 tabs x 2 days, 3 tabs daily x 2 days, 2 tabs daily x 2 days, 1 tab daily x 2 days, then half tab daily.   60 tablet   0   . vitamin B-12 1000 MCG tablet   Oral   Take 1 tablet (1,000 mcg total) by mouth daily.   30 tablet   0   . Vitamin D, Ergocalciferol, (DRISDOL) 50000 UNITS CAPS capsule   Oral   Take 50,000 Units by mouth every Friday.          . warfarin (COUMADIN) 5 MG tablet   Oral   Take 5 mg by mouth at bedtime.           BP 97/61  Pulse 98  Temp(Src) 98.1 F (36.7 C) (Oral)  Resp 20  SpO2 94% Physical Exam  Nursing note and vitals reviewed. Constitutional: She is oriented to person, place, and time. She appears well-developed and well-nourished.  HENT:  Head: Normocephalic and  atraumatic.  Right Ear: External ear normal.  Left Ear: External ear normal.  Mouth/Throat: Mucous membranes are dry.  Eyes: Conjunctivae are normal.  Neck: Normal range of motion. Neck supple.  Cardiovascular: Normal rate, regular  rhythm, normal heart sounds and intact distal pulses.   Pulmonary/Chest: Effort normal. No respiratory distress. She has wheezes. She has rhonchi.  Abdominal: Soft. Bowel sounds are normal. There is no tenderness.  Musculoskeletal: Normal range of motion. She exhibits no edema.  Neurological: She is alert and oriented to person, place, and time.  Skin: Skin is warm and dry.  Psychiatric: She has a normal mood and affect.    ED Course  Procedures (including critical care time) Labs Review Labs Reviewed  CBC WITH DIFFERENTIAL - Abnormal; Notable for the following:    WBC 15.0 (*)    Hemoglobin 9.8 (*)    HCT 31.4 (*)    MCH 24.5 (*)    RDW 22.2 (*)    All other components within normal limits  COMPREHENSIVE METABOLIC PANEL - Abnormal; Notable for the following:    Sodium 136 (*)    Glucose, Bld 149 (*)    Creatinine, Ser 0.39 (*)    Albumin 2.8 (*)    All other components within normal limits  PROTIME-INR - Abnormal; Notable for the following:    Prothrombin Time 32.6 (*)    INR 3.33 (*)    All other components within normal limits  CULTURE, BLOOD (ROUTINE X 2)  CULTURE, BLOOD (ROUTINE X 2)  LEGIONELLA ANTIGEN, URINE  STREP PNEUMONIAE URINARY ANTIGEN  INFLUENZA PANEL BY PCR (TYPE A & B, H1N1)   Imaging Review Dg Chest 2 View  07/26/2013   CLINICAL DATA:  Pneumonia  EXAM: CHEST  2 VIEW  COMPARISON:  07/06/2013  FINDINGS: Cardiomediastinal silhouette is stable. There is patchy pneumonia in right lower lobe. Follow-up to resolution is recommended. Bony thorax is stable. Left lung is clear.  IMPRESSION: Patchy pneumonia in right lower lobe. Follow-up to resolution is recommended.   Electronically Signed   By: Natasha Mead M.D.   On: 07/26/2013 14:34     EKG Interpretation   None       MDM   1. HCAP (healthcare-associated pneumonia)    53 yo female with COPD, lupus anticoagulant disorder, and recent respiratory failure secondary to CAP presents with persistent cough, fever, and wheezing. SBP ranging 96-102, alert and oriented, no acute respiratory distress. No evidence of sepsis at this time. O2 sat 92-94% on RA indicating suboptimal oxygenation. O2 2 liters via [place] initiated. Labs reveal worsening leukocytosis. INR is slightly supratherapeutic at 3.33, no tachycardia, no clinical findings for DVT, doubt PE. CXR confirms persistent pneumonia. Given her recent admission for sepsis and respiratory failure, vanc and zosyn initiated for HCAP after blood cultures obtained. Combivent nebulizers and hydration initiated.  Hospitalist consulted and arrangements made for admission.    Simmie Davies, NP 07/26/13 1558

## 2013-07-26 NOTE — ED Notes (Signed)
Pt a+ox4, presents following PCP appt.  Pt reports dx PNA 07/05/13 with subsequent hospitalization and tx.  Pt reports f/u CT x1 week ago "showed pneumonia in both my lungs".  Pt sts seen by PCP this AM and told to come to ER for treatment.  Pt reports + prod cough of green sputum, SOB, diarrhea and subjective fevers.  Pt speaking full/clear sentences, rr even/un-lab, no cough noted at this time.  LS congested, rhonchorous L>R.  Skin hot.  Pt denies cp.

## 2013-07-26 NOTE — ED Notes (Signed)
First contact with pt. Report called. Pt informed of plan  of care. Pt alert x4 respirations easy non labored.

## 2013-07-26 NOTE — ED Provider Notes (Signed)
Medical screening examination/treatment/procedure(s) were performed by non-physician practitioner and as supervising physician I was immediately available for consultation/collaboration.  EKG Interpretation    Date/Time:    Ventricular Rate:    PR Interval:    QRS Duration:   QT Interval:    QTC Calculation:   R Axis:     Text Interpretation:                Layla MawKristen N Arlander Gillen, DO 07/26/13 1642

## 2013-07-26 NOTE — Progress Notes (Signed)
ANTIBIOTIC CONSULT NOTE - INITIAL  Pharmacy Consult for vancomycin, cefepime Indication: HACP  No Known Allergies  Patient Measurements:   07/07/13: Wt 67.6 kg, Ht 68 in, IBW 64 kg  Vital Signs: Temp: 98.1 F (36.7 C) (01/22 1305) Temp src: Oral (01/22 1305) BP: 101/67 mmHg (01/22 1509) Pulse Rate: 80 (01/22 1509) Intake/Output from previous day:   Intake/Output from this shift:    Labs:  Recent Labs  07/26/13 1345  WBC 15.0*  HGB 9.8*  PLT 390  CREATININE 0.39*   The CrCl is unknown because both a height and weight (above a minimum accepted value) are required for this calculation. No results found for this basename: VANCOTROUGH, VANCOPEAK, VANCORANDOM, [place], GENT[place]AK, [place], [place], [place], TOBRARND, AMIKACINPEAK, AMIKACINTROU, AMIKACIN,  in the last 72 hours   Microbiology: Recent Results (from the past 720 hour(s))  CULTURE, BLOOD (ROUTINE X 2)     Status: None   Collection Time    07/06/13  3:35 AM      Result Value Range Status   Specimen Description BLOOD RIGHT FOREARM   Final   Special Requests BOTTLES DRAWN AEROBIC AND ANAEROBIC 5CC   Final   Culture  Setup Time     Final   Value: 07/06/2013 09:03     Performed at [facility]     Final   Value: NO GROWTH 5 DAYS     Performed at [facility]   Report Status 07/12/2013 FINAL   Final  CULTURE, BLOOD (ROUTINE X 2)     Status: None   Collection Time    07/06/13  4:25 AM      Result Value Range Status   Specimen Description BLOOD RIGHT HAND   Final   Special Requests BOTTLES DRAWN AEROBIC AND ANAEROBIC 4CC   Final   Culture  Setup Time     Final   Value: 07/06/2013 09:03     Performed at [facility]   Culture     Final   Value: NO GROWTH 5 DAYS     Performed at [facility]   Report Status 07/12/2013 FINAL   Final  URINE CULTURE     Status: None   Collection Time    07/06/13  6:35 AM      Result Value Range Status   Specimen Description  URINE, RANDOM   Final   Special Requests NONE   Final   Culture  Setup Time     Final   Value: 07/06/2013 12:55     Performed at [facility]     Final   Value: 30,000 COLONIES/ML     Performed at [facility]   Culture     Final   Value: Multiple bacterial morphotypes present, none predominant. Suggest appropriate recollection if clinically indicated.     Performed at [facility]   Report Status 07/07/2013 FINAL   Final  RESPIRATORY VIRUS PANEL     Status: None   Collection Time    07/06/13  6:52 AM      Result Value Range Status   Source - RVPAN NASOPHARYNGEAL   Final   Respiratory Syncytial Virus A NOT DETECTED   Final   Respiratory Syncytial Virus B NOT DETECTED   Final   Influenza A NOT DETECTED   Final   Influenza B NOT DETECTED   Final   Parainfluenza 1 NOT DETECTED   Final   Parainfluenza 2 NOT  DETECTED   Final   Parainfluenza 3 NOT DETECTED   Final   Metapneumovirus NOT DETECTED   Final   Rhinovirus NOT DETECTED   Final   Adenovirus NOT DETECTED   Final   Influenza A H1 NOT DETECTED   Final   Influenza A H3 NOT DETECTED   Final   Comment: (NOTE)           Normal Reference Range for each Analyte: NOT DETECTED     Testing performed using the Luminex xTAG Respiratory Viral Panel test     kit.     This test was developed and its performance characteristics determined     by [facility]. It has not been cleared or approved by the [place]     Food and Drug Administration. This test is used for clinical purposes.     It should not be regarded as investigational or for research. This     laboratory is certified under the [place] (CLIA) as qualified to perform high complexity     clinical laboratory testing.     Performed at [facility] PCR SCREENING     Status: None   Collection Time    07/06/13  6:58 AM      Result Value Range Status   MRSA by PCR NEGATIVE  NEGATIVE  Final   Comment:            The GeneXpert MRSA Assay (FDA     approved for NASAL specimens     only), is one component of a     comprehensive MRSA colonization     surveillance program. It is not     intended to diagnose MRSA     infection nor to guide or     monitor treatment for     MRSA infections.    Medical History: Past Medical History  Diagnosis Date  . DVT (deep venous thrombosis)   . COPD (chronic obstructive pulmonary disease)   . Bronchitis   . 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 Lupus anticoagulant disorder   . Osteoporosis   . Chronic pain syndrome     Phantom leg pain, right  . History of pleural effusion     Status post sclerotherapy  . Chronic anticoagulation     Medications:  Scheduled:   Infusions:  . sodium chloride    . ceFEPime (MAXIPIME) IV    . piperacillin-tazobactam (ZOSYN)  IV 3.375 g (07/26/13 1506)  . sodium chloride    . vancomycin     Assessment: 53 yo female presents to ER following PCP appt after reported productive cough, SOB, diarrhea and fever. Note patient with recent admission for AECOPD, CAP treated with vanc, cefepime and zithromax during hospitalization and discharged home with 1 week course of Augmentin. To treat again with vancomycin and Cefepime for persistent HAP. Elevated WBC of 15 at baseline, SCr 0.39 with estimated CrCl of about 88 ml/min (rounding SCr up to 0.8). Afebrile in ER.  1235m IV Vanc x 1 ordered in ER  3.375g IV Zosyn x 1 ordered and given in ER  Goal of Therapy:  Vancomycin trough level 15-20 mcg/ml  Plan:  1) Vancomycin 12550mIV x 1 started in ER at 1526 2) Start vancomycin 75091mV q12 thereafter x 8 days 3) Cefepime 1g IV q8 x 8 days to start 6 hours after Zosyn given in ER at [address]  Eustaquio Boyden, PharmD, BCPS Pager [phone] 07/26/2013 3:29 PM

## 2013-07-26 NOTE — H&P (Signed)
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 On last hospitalization she was treated with vanc, cefipime, and zithromax and discharged home on tapering dose of prednisone, albuterol nebs and 1 week course of augmentin which she completed. She has not gotten much better and was referred by her PCP to go to ED. In ED, pt stable hemodynamically, PNA noted on CXR, RLL. TRH asked to admit for further management of HCAP.   Assessment and Plan: Principal Problem:   Acute respiratory failure with hypoxia - secondary to HCAP - place on broad spectrum ABX - sputum analysis ordered, urine legionella, strep pneumo  Active Problems:   HCAP (healthcare-associated pneumonia) - placed on Vancomycin and Maxipime - provide oxygen  - BD's as needed, solumedrol    COPD exacerbation - management as noted above,    History of DVT of lower extremity - INR supra therapeutic, hold Coumadin     Adrenal insufficiency - monitor closely  - solumedrol started    Lupus anticoagulant disorder   Anemia of chronic disease - Hg and Hct stable and at pt's baseline - CBC in AM   Code Status: Full Family Communication: Pt at bedside Disposition Plan: Admit to medical floor    Review of Systems:  Constitutional: Negative for diaphoresis.  HENT: Negative for  hearing loss, ear pain, nosebleeds, congestion, sore throat, neck pain, tinnitus and ear discharge.   Eyes: Negative for blurred vision, double vision, photophobia, pain, discharge and redness.  Respiratory: Negative for wheezing and stridor.   Cardiovascular: Negative for chest pain, palpitations, orthopnea, claudication and leg swelling.  Gastrointestinal: Negative for heartburn, constipation, blood in stool and melena.  Genitourinary: Negative for dysuria, urgency, frequency, hematuria and flank pain.  Musculoskeletal: Negative for myalgias, back pain, joint pain and falls.  Skin: Negative for itching and rash.  Neurological: Negative for tingling, tremors, sensory change, speech change, focal weakness, loss of consciousness and headaches.  Endo/Heme/Allergies: Negative for environmental allergies and polydipsia. Does not bruise/bleed easily.  Psychiatric/Behavioral: Negative for suicidal ideas. The patient is not nervous/anxious.      Past Medical History  Diagnosis Date  . DVT (deep venous thrombosis)   . COPD (chronic obstructive pulmonary disease)   . Bronchitis   . 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 Lupus anticoagulant disorder   . Osteoporosis   . Chronic pain syndrome     Phantom leg pain, right  . History of pleural effusion     Status post sclerotherapy  . Chronic anticoagulation     Past Surgical History  Procedure Laterality Date  . Leg amputation below knee Right   . Cholecystectomy    . Tonsillectomy    . Gastric bypass      Social History:  reports that she has been smoking Cigarettes.  She has been smoking about 0.50 packs per day. She has never used smokeless tobacco. She reports that she does not drink alcohol or  use illicit drugs.  No Known Allergies  Family History  Problem Relation Age of Onset  . Cancer Father   . Migraines Sister   . Stroke Sister     Prior to Admission medications   Medication Sig Start Date End Date Taking?  Authorizing Provider  acetaminophen (TYLENOL) 500 MG tablet Take 500 mg by mouth every 6 (six) hours as needed for pain.   Yes Historical Provider, MD  ADVAIR DISKUS 500-50 MCG/DOSE AEPB Inhale 1 puff into the lungs 2 (two) times daily.  03/06/13  Yes Historical Provider, MD  albuterol (PROVENTIL HFA;VENTOLIN HFA) 108 (90 BASE) MCG/ACT inhaler Inhale 2 puffs into the lungs every 4 (four) hours as needed for wheezing or shortness of breath. 05/25/13  Yes Suzi Roots, MD  benzonatate (TESSALON) 200 MG capsule Take 1 capsule (200 mg total) by mouth 3 (three) times daily. 07/09/13   Renae Fickle, MD  DULoxetine (CYMBALTA) 60 MG capsule Take 60 mg by mouth daily.  04/10/13   Historical Provider, MD  feeding supplement, ENSURE COMPLETE, (ENSURE COMPLETE) LIQD Take 237 mLs by mouth 2 (two) times daily between meals. 07/09/13   Renae Fickle, MD  fentaNYL (DURAGESIC - DOSED MCG/HR) 50 MCG/HR Place 1 patch onto the skin every 3 (three) days.  04/16/13   Historical Provider, MD  ferrous sulfate 325 (65 FE) MG tablet Take 1 tablet (325 mg total) by mouth 3 (three) times daily with meals. 07/09/13   Renae Fickle, MD  gabapentin (NEURONTIN) 600 MG tablet Take 600-1,200 mg by mouth 3 (three) times daily. Take 600mg  in the morning, 600mg  in the afternoon and 1200mg  at bedtime 04/10/13   Historical Provider, MD  guaiFENesin (MUCINEX) 600 MG 12 hr tablet Take 600 mg by mouth 2 (two) times daily as needed for cough.    Historical Provider, MD  ipratropium-albuterol (DUONEB) 0.5-2.5 (3) MG/3ML SOLN Inhale 3 mLs into the lungs every 4 (four) hours as needed (wheezing).  04/12/13   Historical Provider, MD  oxyCODONE (ROXICODONE) 15 MG immediate release tablet Take 15 mg by mouth every 6 (six) hours as needed for pain.  04/16/13   Historical Provider, MD  predniSONE (DELTASONE) 10 MG tablet Take 5 tabs daily x 2 days, 4 tabs x 2 days, 3 tabs daily x 2 days, 2 tabs daily x 2 days, 1 tab daily x 2 days, then half tab daily.  07/09/13   Renae Fickle, MD  vitamin B-12 1000 MCG tablet Take 1 tablet (1,000 mcg total) by mouth daily. 07/09/13   Renae Fickle, MD  Vitamin D, Ergocalciferol, (DRISDOL) 50000 UNITS CAPS capsule Take 50,000 Units by mouth every Friday.  04/10/13   Historical Provider, MD  warfarin (COUMADIN) 5 MG tablet Take 5 mg by mouth at bedtime.  04/10/13   Historical Provider, MD    Physical Exam: Filed Vitals:   07/26/13 1305 07/26/13 1417 07/26/13 1509  BP: 97/61  101/67  Pulse: 98  80  Temp: 98.1 F (36.7 C)    TempSrc: Oral    Resp: 20  14  SpO2: 94% 92% 93%    Physical Exam  Constitutional: Appears well-developed and well-nourished. In mild distress due to  Pain  HENT: Normocephalic. External right and left ear normal. Oropharynx is clear and moist.  Eyes: Conjunctivae and EOM are normal. PERRLA, no scleral icterus.  Neck: Normal ROM. Neck supple. No JVD. No tracheal deviation. No thyromegaly.  CVS: RRR, S1/S2 +, no murmurs, no gallops, no carotid bruit.  Pulmonary: Effort  and breath sounds normal, no stridor, bibasilar rhonchi  Abdominal: Soft. BS +,  no distension, tenderness, rebound or guarding.  Musculoskeletal: Normal range of motion. No edema and no tenderness.  Lymphadenopathy: No lymphadenopathy noted, cervical, inguinal. Neuro: Alert. Normal reflexes, muscle tone coordination. No cranial nerve deficit. Skin: Skin is warm and dry. No rash noted. Not diaphoretic. No erythema. No pallor.  Psychiatric: Normal mood and affect. Behavior, judgment, thought content normal.   Labs on Admission:  Basic Metabolic Panel:  Recent Labs Lab 07/26/13 1345  NA 136*  K 3.9  CL 98  CO2 25  GLUCOSE 149*  BUN 7  CREATININE 0.39*  CALCIUM 8.9   Liver Function Tests:  Recent Labs Lab 07/26/13 1345  AST 12  ALT 8  ALKPHOS 95  BILITOT 0.3  PROT 7.5  ALBUMIN 2.8*   CBC:  Recent Labs Lab 07/26/13 1345  WBC 15.0*  NEUTROABS 14.0*  HGB 9.8*  HCT 31.4*  MCV 78.5  PLT 390    Radiological Exams on Admission: CXR 07/26/2013  Patchy pneumonia in right lower lobe. Follow-up to resolution is recommended.    EKG: Normal sinus rhythm, no ST/T wave changes  Robin Prince, ISKRA, MD  [facility] Pager [phone]  If 7PM-7AM, please contact night-coverage [email or web address] Password [email or web address] 07/26/2013, 4:12 PM

## 2013-07-26 NOTE — Progress Notes (Signed)
ANTICOAGULATION CONSULT NOTE - Initial Consult  Pharmacy Consult for warfarin Indication: DVT and lupus anticoagulant  No Known Allergies  Patient Measurements: Height: 5\' 8"  (172.7 cm) Weight: 142 lb 3.2 oz (64.501 kg) IBW/kg (Calculated) : 63.9  Vital Signs: Temp: 97.6 F (36.4 C) (01/22 1634) Temp src: Oral (01/22 1634) BP: 103/65 mmHg (01/22 1634) Pulse Rate: 73 (01/22 1634)  Labs:  Recent Labs  07/26/13 1345  HGB 9.8*  HCT 31.4*  PLT 390  LABPROT 32.6*  INR 3.33*  CREATININE 0.39*    Estimated Creatinine Clearance: 83.9 ml/min (by C-G formula based on Cr of 0.39).   Medical History: Past Medical History  Diagnosis Date  . DVT (deep venous thrombosis)   . COPD (chronic obstructive pulmonary disease)   . Bronchitis   . 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 Lupus anticoagulant disorder   . Osteoporosis   . Chronic pain syndrome     Phantom leg pain, right  . History of pleural effusion     Status post sclerotherapy  . Chronic anticoagulation     Medications:  Scheduled:  . sodium chloride   Intravenous STAT  . albuterol  2.5 mg Nebulization Q6H  . benzonatate  200 mg Oral TID  . ceFEPime (MAXIPIME) IV  1 g Intravenous Q8H  . DULoxetine  60 mg Oral Daily  . [START ON 07/27/2013] feeding supplement (ENSURE COMPLETE)  237 mL Oral BID BM  . fentaNYL  50 mcg Transdermal Q72H  . ferrous sulfate  325 mg Oral TID WC  . gabapentin  600-1,200 mg Oral TID  . ipratropium  0.5 mg Nebulization Q6H  . methylPREDNISolone (SOLU-MEDROL) injection  60 mg Intravenous Q12H  . sodium chloride  1,000 mL Intravenous Once  . sodium chloride  3 mL Intravenous Q12H  . vancomycin  1,250 mg Intravenous STAT  . [START ON 07/27/2013] vancomycin  750 mg Intravenous Q12H  . vitamin B-12  1,000 mcg Oral Daily  . [START ON 07/27/2013] Vitamin D (Ergocalciferol)  50,000 Units Oral Q Fri   Infusions:  . sodium chloride      Assessment: 53 yo admitted with cough, SOB,  fever likely HCAP and hx recurrent aspiration PNA to continue warfarin as inpatient as patient takes for hx DVT and lupus anticoagulant disorder. Patient reportedly takes 5mg  qhs PTA  INR currently supratherapeutic  CBC Ok  Goal of Therapy:  INR 2-3  Plan:  1) No warfarin tonight due INR > 3 2) Daily INR   Hessie KnowsJustin M Rayen Dafoe, PharmD, BCPS Pager [phone] 07/26/2013 4:50 PM

## 2013-07-27 DIAGNOSIS — E2749 Other adrenocortical insufficiency: Secondary | ICD-10-CM

## 2013-07-27 LAB — INFLUENZA PANEL BY PCR (TYPE A & B)
H1N1 flu by pcr: NOT DETECTED
INFLAPCR: NEGATIVE
Influenza B By PCR: NEGATIVE

## 2013-07-27 LAB — COMPREHENSIVE METABOLIC PANEL
ALBUMIN: 2.4 g/dL — AB (ref 3.5–5.2)
ALK PHOS: 76 U/L (ref 39–117)
ALT: 6 U/L (ref 0–35)
AST: 7 U/L (ref 0–37)
BUN: 7 mg/dL (ref 6–23)
CHLORIDE: 106 meq/L (ref 96–112)
CO2: 25 meq/L (ref 19–32)
CREATININE: 0.31 mg/dL — AB (ref 0.50–1.10)
Calcium: 8.8 mg/dL (ref 8.4–10.5)
GFR calc Af Amer: >90 mL/min (ref >90)
Glucose, Bld: 125 mg/dL — ABNORMAL HIGH (ref 70–99)
POTASSIUM: 3.8 meq/L (ref 3.7–5.3)
Sodium: 140 mEq/L (ref 137–147)
Total Bilirubin: 0.2 mg/dL — ABNORMAL LOW (ref 0.3–1.2)
Total Protein: 6.4 g/dL (ref 6.0–8.3)

## 2013-07-27 LAB — STREP PNEUMONIAE URINARY ANTIGEN: STREP PNEUMO URINARY ANTIGEN: POSITIVE — AB

## 2013-07-27 LAB — PROTIME-INR
INR: 4.46 — ABNORMAL HIGH (ref 0.00–1.49)
PROTHROMBIN TIME: 40.7 s — AB (ref 11.6–15.2)

## 2013-07-27 LAB — LEGIONELLA ANTIGEN, URINE: Legionella Antigen, Urine: NEGATIVE

## 2013-07-27 LAB — GLUCOSE, CAPILLARY: Glucose-Capillary: 119 mg/dL — ABNORMAL HIGH (ref 70–99)

## 2013-07-27 LAB — CBC
HCT: 27.5 % — ABNORMAL LOW (ref 36.0–46.0)
Hemoglobin: 8.6 g/dL — ABNORMAL LOW (ref 12.0–15.0)
MCH: 24.8 pg — AB (ref 26.0–34.0)
MCHC: 31.3 g/dL (ref 30.0–36.0)
MCV: 79.3 fL (ref 78.0–100.0)
PLATELETS: 376 10*3/uL (ref 150–400)
RBC: 3.47 MIL/uL — ABNORMAL LOW (ref 3.87–5.11)
RDW: 22.6 % — AB (ref 11.5–15.5)
WBC: 10 10*3/uL (ref 4.0–10.5)

## 2013-07-27 NOTE — Evaluation (Signed)
Physical Therapy One Time Evaluation Patient Details Name: Robin Prince MRN: [id number] DOB: 08/12/1960 Today's Date: 07/27/2013 Time: [id number] PT Time Calculation (min): 18 min  PT Assessment / Plan / Recommendation History of Present Illness  53 yo female with hx of lupus anticoagulant disorder, COPD (not on home O2), DVT--chronically anticoagulated on coumadin--and recent admission for acute hypoxic respiratory failure due to acute COPD exac and CAP and adrenal insufficiency (1/2-07/09/13) [facility] with main concern of several days duration of progressively worsening shortness of breath, worse with exertion, associated with fevers, chills, cough productive of yellow to green sputum, malaise, poor oral intake. On last hospitalization she was treated with vanc, cefipime, and zithromax and discharged home on tapering dose of prednisone, albuterol nebs and 1 week course of augmentin which she completed. She has not gotten much better and was referred by her PCP to go to ED. In ED, pt stable hemodynamically, PNA noted on CXR, RLL. TRH asked to admit for further management of HCAP.   Clinical Impression  Patient evaluated by Physical Therapy with no further acute PT needs identified. All education has been completed and the patient has no further questions. Pt donned R LE prosthesis independently however slowly.  Pt requiring increased time due to fatigue and reports just not feeling well however mobility appears good.  Pt encouraged to ambulate with staff as tolerated.  Pt also reports some mild SOB with ambulation however SpO2 92-93% on room air during activity. See below for any follow-up Physial Therapy or equipment needs. PT is signing off. Thank you for this referral.     PT Assessment  Patent does not need any further PT services    Follow Up Recommendations  No PT follow up    Does the patient have the potential to tolerate intense rehabilitation      Barriers to  Discharge        Equipment Recommendations  None recommended by PT    Recommendations for Other Services     Frequency      Precautions / Restrictions Precautions Precautions: None   Pertinent Vitals/Pain See below, no c/o pain only "not feeling well"      Mobility  Bed Mobility Overal bed mobility: Modified Independent Transfers Overall transfer level: Modified independent Equipment used: Straight cane Ambulation/Gait Ambulation/Gait assistance: Supervision Ambulation Distance (Feet): 160 Feet Assistive device: Straight cane Gait Pattern/deviations: Step-through pattern;Narrow base of support;Decreased stride length Gait velocity: decr General Gait Details: very short stride, monitored SpO2 room air 92-93% during ambulation    Exercises     PT Diagnosis:    PT Problem List:   PT Treatment Interventions:       PT Goals(Current goals can be found in the care plan section) Acute Rehab PT Goals PT Goal Formulation: No goals set, d/c therapy  Visit Information  Last PT Received On: 07/27/13 Assistance Needed: +1 History of Present Illness: 53 yo female with hx of lupus anticoagulant disorder, COPD (not on home O2), DVT--chronically anticoagulated on coumadin--and recent admission for acute hypoxic respiratory failure due to acute COPD exac and CAP and adrenal insufficiency (1/2-07/09/13) [facility] with main concern of several days duration of progressively worsening shortness of breath, worse with exertion, associated with fevers, chills, cough productive of yellow to green sputum, malaise, poor oral intake. On last hospitalization she was treated with vanc, cefipime, and zithromax and discharged home on tapering dose of prednisone, albuterol nebs and 1 week course of augmentin which she  completed. She has not gotten much better and was referred by her PCP to go to ED. In ED, pt stable hemodynamically, PNA noted on CXR, RLL. TRH asked to admit for further management of  HCAP.        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) Type of Home: House Home Access: Level entry Home Layout: One level Home Equipment: Wheelchair - [employer] - single point Prior Function Level of Independence: Independent with assistive device(s) Comments: states she mostly uses w/c Communication Communication: No difficulties    Cognition  Cognition Arousal/Alertness: Awake/alert Behavior During Therapy: WFL for tasks assessed/performed Overall Cognitive Status: Within Functional Limits for tasks assessed    Extremity/Trunk Assessment Lower Extremity Assessment Lower Extremity Assessment: RLE deficits/detail 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

## 2013-07-27 NOTE — Progress Notes (Signed)
Patient ID: Robin Prince, female   DOB: 04/04/1961, 53 y.o.   MRN: [id number]  [facility] PROGRESS NOTE  Robin Prince:[id number] DOB: [birth year] DOA: 07/26/2013 PCP: Charlette Caffey, MD  Brief narrative: 53 yo female with hx of lupus anticoagulant disorder, COPD (not on home O2), DVT--chronically anticoagulated on coumadin--and recent admission for acute hypoxic respiratory failure due to acute COPD exac and CAP and adrenal insufficiency (1/2-07/09/13) presented to [facility] ED with main concern of several days duration of progressively worsening shortness of breath, worse with exertion, associated with fevers, chills, cough productive of yellow to green sputum, malaise, poor oral intake. On last hospitalization she was treated with vanc, cefipime, and zithromax and discharged home on tapering dose of prednisone, albuterol nebs and 1 week course of augmentin which she completed. She has not gotten much better and was referred by her PCP to go to ED. In ED, pt stable hemodynamically, PNA noted on CXR, RLL. TRH asked to admit for further management of HCAP.   Assessment and Plan:  Principal Problem:  Acute respiratory failure with hypoxia  - secondary to HCAP, strep pneumo +  - placed on broad spectrum ABX and pt responding  - sputum analysis ordered, urine legionella negative to date - influenza panel negative  Active Problems:  HCAP (healthcare-associated pneumonia), strep pneumo + - placed on Vancomycin and Maxipime, continue day #2 and naroow down once sputum analysis is finalized  - provide oxygen  - BD's as needed, solumedrol  COPD exacerbation  - management as noted above - this is clinically stable  History of DVT of lower extremity  - INR supra therapeutic, continue to hold Coumadin  Adrenal insufficiency  - monitor closely  - solumedrol started  Lupus anticoagulant disorder  Anemia of chronic disease  - Hg and Hct slightly down over 24 hours, ? Dilutional  component  - no signs of active bleeding  - CBC in AM   Consultants:  None Procedures/Studies:  Dg Chest 2 View   07/26/2013   Patchy pneumonia in right lower lobe. Follow-up to resolution is recommended.   Antibiotics:  Vancomycin 01/22 -->   Maxipime 01/22 -->  Code Status: Full Family Communication: Pt at bedside Disposition Plan: Home when medically stable  HPI/Subjective: No events overnight.   Objective: Filed Vitals:   07/27/13 0436 07/27/13 0925 07/27/13 1417 07/27/13 1456  BP: 108/70  105/67   Pulse: 85  87   Temp: 97.6 F (36.4 C)  98.3 F (36.8 C)   TempSrc: Oral  Oral   Resp: 16  18   Height:      Weight: 62.37 kg (137 lb 8 oz)     SpO2: 99% 98% 93% 97%    Intake/Output Summary (Last 24 hours) at 07/27/13 1703 Last data filed at 07/27/13 1418  Gross per 24 hour  Intake   1240 ml  Output      0 ml  Net   1240 ml    Exam:   General:  Pt is alert, follows commands appropriately, not in acute distress  Cardiovascular: Regular rate and rhythm, S1/S2, no murmurs, no rubs, no gallops  Respiratory: Clear to auscultation bilaterally, bibasilar rhonchi   Abdomen: Soft, non tender, non distended, bowel sounds present, no guarding  Neuro: Grossly nonfocal  Data Reviewed: Basic Metabolic Panel:  Recent Labs Lab 07/26/13 1345 07/26/13 1716 07/27/13 0745  NA 136* 138 140  K 3.9 3.9 3.8  CL 98 101 106  CO2 25 24  25  GLUCOSE 149* 150* 125*  BUN 7 6 7   CREATININE 0.39* 0.38* 0.31*  CALCIUM 8.9 8.6 8.8  MG  --  1.7  --   PHOS  --  3.3  --    Liver Function Tests:  Recent Labs Lab 07/26/13 1345 07/26/13 1716 07/27/13 0745  AST 12 10 7   ALT 8 7 6   ALKPHOS 95 94 76  BILITOT 0.3 0.3 0.2*  PROT 7.5 7.1 6.4  ALBUMIN 2.8* 2.6* 2.4*   CBC:  Recent Labs Lab 07/26/13 1345 07/26/13 1716 07/27/13 0745  WBC 15.0* 9.9 10.0  NEUTROABS 14.0* 9.0*  --   HGB 9.8* 9.5* 8.6*  HCT 31.4* 30.8* 27.5*  MCV 78.5 79.6 79.3  PLT 390 380 376    Recent Results (from the past 240 hour(s))  CULTURE, BLOOD (ROUTINE X 2)     Status: None   Collection Time    07/26/13  2:00 PM      Result Value Range Status   Specimen Description BLOOD RIGHT WRIST   Final   Special Requests BOTTLES DRAWN AEROBIC AND ANAEROBIC 5ML   Final   Culture  Setup Time     Final   Value: 07/26/2013 20:35     Performed at [facility]   Culture     Final   Value:        BLOOD CULTURE RECEIVED NO GROWTH TO DATE CULTURE WILL BE HELD FOR 5 DAYS BEFORE ISSUING A FINAL NEGATIVE REPORT     Performed at [facility]   Report Status PENDING   Incomplete  CULTURE, BLOOD (ROUTINE X 2)     Status: None   Collection Time    07/26/13  2:50 PM      Result Value Range Status   Specimen Description BLOOD LEFT ARM   Final   Special Requests BOTTLES DRAWN AEROBIC AND ANAEROBIC 5ML   Final   Culture  Setup Time     Final   Value: 07/26/2013 20:36     Performed at [facility]   Culture     Final   Value:        BLOOD CULTURE RECEIVED NO GROWTH TO DATE CULTURE WILL BE HELD FOR 5 DAYS BEFORE ISSUING A FINAL NEGATIVE REPORT     Performed at [facility]   Report Status PENDING   Incomplete     Scheduled Meds: . benzonatate  200 mg Oral TID  . ceFEPime (MAXIPIME) IV  1 g Intravenous Q8H  . DULoxetine  60 mg Oral Daily  . feeding supplement (ENSURE COMPLETE)  237 mL Oral BID BM  . fentaNYL  50 mcg Transdermal Q72H  . ferrous sulfate  325 mg Oral TID WC  . gabapentin  1,200 mg Oral QHS  . gabapentin  600 mg Oral BID  . ipratropium-albuterol  3 mL Nebulization Q6H  . methylPREDNISolone (SOLU-MEDROL) injection  60 mg Intravenous Q12H  . sodium chloride  3 mL Intravenous Q12H  . vancomycin  750 mg Intravenous Q12H  . vitamin B-12  1,000 mcg Oral Daily  . Vitamin D (Ergocalciferol)  50,000 Units Oral Q Fri  . Warfarin - Pharmacist Dosing Inpatient   Does not apply q1800   Continuous Infusions:  Debbora PrestoMAGICK-Helmi Hechavarria, MD  [facility] Pager  [phone]  If 7PM-7AM, please contact night-coverage [email or web address] Password [email or web address] 07/27/2013, 5:03 PM   LOS: 1 day

## 2013-07-27 NOTE — Progress Notes (Signed)
ANTICOAGULATION CONSULT NOTE - Follow Up Consult  Pharmacy Consult for Warfarin Indication: Hx DVT/PE  No Known Allergies  Patient Measurements: Height: 5\' 8"  (172.7 cm) Weight: 137 lb 8 oz (62.37 kg) IBW/kg (Calculated) : 63.9  Vital Signs: Temp: 97.6 F (36.4 C) (01/23 0436) Temp src: Oral (01/23 0436) BP: 108/70 mmHg (01/23 0436) Pulse Rate: 85 (01/23 0436)  Labs:  Recent Labs  07/26/13 1345 07/26/13 1716 07/27/13 0745  HGB 9.8* 9.5* 8.6*  HCT 31.4* 30.8* 27.5*  PLT 390 380 376  APTT  --  74*  --   LABPROT 32.6* 29.4* 40.7*  INR 3.33* 2.91* 4.46*  CREATININE 0.39* 0.38* 0.31*    Estimated Creatinine Clearance: 82 ml/min (by C-G formula based on Cr of 0.31).   Medications:  Scheduled:  . benzonatate  200 mg Oral TID  . ceFEPime (MAXIPIME) IV  1 g Intravenous Q8H  . DULoxetine  60 mg Oral Daily  . feeding supplement (ENSURE COMPLETE)  237 mL Oral BID BM  . fentaNYL  50 mcg Transdermal Q72H  . ferrous sulfate  325 mg Oral TID WC  . gabapentin  1,200 mg Oral QHS  . gabapentin  600 mg Oral BID  . ipratropium-albuterol  3 mL Nebulization Q6H  . methylPREDNISolone (SOLU-MEDROL) injection  60 mg Intravenous Q12H  . sodium chloride  3 mL Intravenous Q12H  . vancomycin  750 mg Intravenous Q12H  . vitamin B-12  1,000 mcg Oral Daily  . Vitamin D (Ergocalciferol)  50,000 Units Oral Q Fri  . Warfarin - Pharmacist Dosing Inpatient   Does not apply q1800   Infusions:    Assessment: Robin Prince admitted 1/22 with possible HCAP.  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  Pharmacy consulted to continue warfarin dosing inpatient.  PTA dose is 5 mg daily and LD reported at 07/25/13  INR: 4.46 this morning, despite holding warfarin dose 1/22.  (INR on admission was 3.33 yesterday afternoon, then down to 2.91 yesterday evening.)  CBC: Hgb decreased from 9.5 to 8.6, Plt remain WNL.  No bleeding or complications reported or  documented.  Potential drug-drug interaction with Cefepime noted; may increase INR, but not expected to be a major change.   Goal of Therapy:  INR 2-3 Monitor platelets by anticoagulation protocol: Yes   Plan:   Hold warfarin today  Daily INR  Pharmacy to f/u daily.  Lynann Beaverhristine Honest Vanleer PharmD, BCPS Pager [phone] 07/27/2013 8:55 AM

## 2013-07-28 LAB — URINE CULTURE
COLONY COUNT: NO GROWTH
Culture: NO GROWTH

## 2013-07-28 LAB — BASIC METABOLIC PANEL
BUN: 9 mg/dL (ref 6–23)
CHLORIDE: 106 meq/L (ref 96–112)
CO2: 23 meq/L (ref 19–32)
Calcium: 8.7 mg/dL (ref 8.4–10.5)
Creatinine, Ser: 0.3 mg/dL — ABNORMAL LOW (ref 0.50–1.10)
GFR calc Af Amer: >90 mL/min (ref >90)
GFR calc non Af Amer: >90 mL/min (ref >90)
Glucose, Bld: 106 mg/dL — ABNORMAL HIGH (ref 70–99)
Potassium: 5.1 mEq/L (ref 3.7–5.3)
SODIUM: 140 meq/L (ref 137–147)

## 2013-07-28 LAB — CBC
HCT: 28.4 % — ABNORMAL LOW (ref 36.0–46.0)
Hemoglobin: 8.5 g/dL — ABNORMAL LOW (ref 12.0–15.0)
MCH: 24.4 pg — ABNORMAL LOW (ref 26.0–34.0)
MCHC: 29.9 g/dL — ABNORMAL LOW (ref 30.0–36.0)
MCV: 81.4 fL (ref 78.0–100.0)
Platelets: 424 10*3/uL — ABNORMAL HIGH (ref 150–400)
RBC: 3.49 MIL/uL — AB (ref 3.87–5.11)
RDW: 23.6 % — ABNORMAL HIGH (ref 11.5–15.5)
WBC: 9.9 10*3/uL (ref 4.0–10.5)

## 2013-07-28 LAB — PROTIME-INR
INR: 4.06 — ABNORMAL HIGH (ref 0.00–1.49)
PROTHROMBIN TIME: 37.9 s — AB (ref 11.6–15.2)

## 2013-07-28 LAB — VANCOMYCIN, TROUGH: VANCOMYCIN TR: 7.8 ug/mL — AB (ref 10.0–20.0)

## 2013-07-28 LAB — GLUCOSE, CAPILLARY: GLUCOSE-CAPILLARY: 122 mg/dL — AB (ref 70–99)

## 2013-07-28 MED ORDER — SODIUM CHLORIDE 0.9 % IV SOLN
1500.0000 mg | Freq: Two times a day (BID) | INTRAVENOUS | Status: DC
  Administered 2013-07-28 – 2013-07-30 (×4): 1500 mg via INTRAVENOUS
  Filled 2013-07-28 (×6): qty 1500

## 2013-07-28 MED ORDER — METHYLPREDNISOLONE SODIUM SUCC 40 MG IJ SOLR
40.0000 mg | Freq: Every day | INTRAMUSCULAR | Status: DC
  Administered 2013-07-29 – 2013-07-30 (×2): 40 mg via INTRAVENOUS
  Filled 2013-07-28 (×2): qty 1

## 2013-07-28 NOTE — Progress Notes (Signed)
Patient ID: Robin Prince, female   DOB: Apr 12, 1961, 53 y.o.   MRN: [id number]  [facility] PROGRESS NOTE  Robin Prince:[id number] DOB: 1961-06-17 DOA: 07/26/2013 PCP: Charlette Caffey, MD  Brief narrative:  53 yo female with hx of lupus anticoagulant disorder, COPD (not on home O2), DVT--chronically anticoagulated on coumadin--and recent admission for acute hypoxic respiratory failure due to acute COPD exac and CAP and adrenal insufficiency (1/2-07/09/13) presented to [facility] ED with main concern of several days duration of progressively worsening shortness of breath, worse with exertion, associated with fevers, chills, cough productive of yellow to green sputum, malaise, poor oral intake. On last hospitalization she was treated with vanc, cefipime, and zithromax and discharged home on tapering dose of prednisone, albuterol nebs and 1 week course of augmentin which she completed. She has not gotten much better and was referred by her PCP to go to ED. In ED, pt stable hemodynamically, PNA noted on CXR, RLL. TRH asked to admit for further management of HCAP.   Assessment and Plan:  Principal Problem:  Acute respiratory failure with hypoxia  - secondary to HCAP, strep pneumo +  - placed on broad spectrum ABX and pt responding well  - sputum analysis ordered, urine legionella negative to date  - influenza panel negative  Active Problems:  HCAP (healthcare-associated pneumonia), strep pneumo +  - placed on Vancomycin and Maxipime, continue day #3 and naroow down once sputum analysis is finalized  - provide oxygen  - BD's as needed, solumedrol  - plan on tapering down solumedrol COPD exacerbation  - management as noted above  - this is clinically stable  History of DVT of lower extremity  - INR supra therapeutic, continue to hold Coumadin  Adrenal insufficiency  - monitor closely  - solumedrol started and will plan on tapering down  Lupus anticoagulant disorder  Anemia  of chronic disease  - Hg and Hct slightly down over 24 hours, ? Dilutional component  - no signs of active bleeding  - CBC in AM   Consultants:  None Procedures/Studies:  Dg Chest 2 View 07/26/2013 Patchy pneumonia in right lower lobe. Follow-up to resolution is recommended.  Antibiotics:  Vancomycin 01/22 -->  Maxipime 01/22 -->  Code Status: Full  Family Communication: Pt at bedside  Disposition Plan: Home when medically stable  HPI/Subjective: No events overnight.   Objective: Filed Vitals:   07/27/13 2008 07/27/13 2037 07/28/13 0419 07/28/13 0746  BP:  102/59 105/63   Pulse: 83 93 79   Temp:  97.8 F (36.6 C) 97.5 F (36.4 C)   TempSrc:  Oral Oral   Resp: 16  18   Height:      Weight:   63.5 kg (139 lb 15.9 oz)   SpO2: 97% 93% 95% 93%    Intake/Output Summary (Last 24 hours) at 07/28/13 1119 Last data filed at 07/28/13 9562  Gross per 24 hour  Intake    890 ml  Output      0 ml  Net    890 ml    Exam:   General:  Pt is alert, follows commands appropriately, not in acute distress  Cardiovascular: Regular rate and rhythm, S1/S2, no murmurs, no rubs, no gallops  Respiratory: Clear to auscultation bilaterally, mild bibasilar rhonchi   Abdomen: Soft, non tender, non distended, bowel sounds present, no guarding  Extremities: No edema, pulses DP and PT palpable bilaterally  Neuro: Grossly nonfocal  Data Reviewed: Basic Metabolic Panel:  Recent Labs  Lab 07/26/13 1345 07/26/13 1716 07/27/13 0745 07/28/13 0455  NA 136* 138 140 140  K 3.9 3.9 3.8 5.1  CL 98 101 106 106  CO2 25 24 25 23   GLUCOSE 149* 150* 125* 106*  BUN 7 6 7 9   CREATININE 0.39* 0.38* 0.31* 0.30*  CALCIUM 8.9 8.6 8.8 8.7  MG  --  1.7  --   --   PHOS  --  3.3  --   --    Liver Function Tests:  Recent Labs Lab 07/26/13 1345 07/26/13 1716 07/27/13 0745  AST 12 10 7   ALT 8 7 6   ALKPHOS 95 94 76  BILITOT 0.3 0.3 0.2*  PROT 7.5 7.1 6.4  ALBUMIN 2.8* 2.6* 2.4*    CBC:  Recent Labs Lab 07/26/13 1345 07/26/13 1716 07/27/13 0745 07/28/13 0455  WBC 15.0* 9.9 10.0 9.9  NEUTROABS 14.0* 9.0*  --   --   HGB 9.8* 9.5* 8.6* 8.5*  HCT 31.4* 30.8* 27.5* 28.4*  MCV 78.5 79.6 79.3 81.4  PLT 390 380 376 424*   CBG:  Recent Labs Lab 07/27/13 0735 07/28/13 0724  GLUCAP 119* 122*    Recent Results (from the past 240 hour(s))  CULTURE, BLOOD (ROUTINE X 2)     Status: None   Collection Time    07/26/13  2:00 PM      Result Value Range Status   Specimen Description BLOOD RIGHT WRIST   Final   Special Requests BOTTLES DRAWN AEROBIC AND ANAEROBIC   Final   Culture  Setup Time     Final   Value: 07/26/2013 20:35     Performed at [facility]   Culture     Final   Value:        BLOOD CULTURE RECEIVED NO GROWTH TO DATE CULTURE WILL BE HELD FOR 5 DAYS BEFORE ISSUING A FINAL NEGATIVE REPORT     Performed at [facility]   Report Status PENDING   Incomplete  CULTURE, BLOOD (ROUTINE X 2)     Status: None   Collection Time    07/26/13  2:50 PM      Result Value Range Status   Specimen Description BLOOD LEFT ARM   Final   Special Requests BOTTLES DRAWN AEROBIC AND ANAEROBIC   Final   Culture  Setup Time     Final   Value: 07/26/2013 20:36     Performed at [facility]   Culture     Final   Value:        BLOOD CULTURE RECEIVED NO GROWTH TO DATE CULTURE WILL BE HELD FOR 5 DAYS BEFORE ISSUING A FINAL NEGATIVE REPORT     Performed at [facility]   Report Status PENDING   Incomplete  URINE CULTURE     Status: None   Collection Time    07/26/13  9:01 PM      Result Value Range Status   Specimen Description URINE, CATHETERIZED   Final   Special Requests NONE   Final   Culture  Setup Time     Final   Value: 07/27/2013 03:47     Performed at [facility]     Final   Value: NO GROWTH     Performed at [facility]   Culture     Final   Value: NO GROWTH     Performed at [facility]   Report Status 07/28/2013 FINAL  Final     Scheduled Meds: . benzonatate  200 mg Oral TID  . ceFEPime (MAXIPIME) IV  1 g Intravenous Q8H  . DULoxetine  60 mg Oral Daily  . feeding supplement (ENSURE COMPLETE)  237 mL Oral BID BM  . fentaNYL  50 mcg Transdermal Q72H  . ferrous sulfate  325 mg Oral TID WC  . gabapentin  1,200 mg Oral QHS  . gabapentin  600 mg Oral BID  . ipratropium-albuterol  3 mL Nebulization Q6H  . methylPREDNISolone (SOLU-MEDROL) injection  60 mg Intravenous Q12H  . sodium chloride  3 mL Intravenous Q12H  . vancomycin  750 mg Intravenous Q12H  . vitamin B-12  1,000 mcg Oral Daily  . Vitamin D (Ergocalciferol)  50,000 Units Oral Q Fri  . Warfarin - Pharmacist Dosing Inpatient   Does not apply q1800   Continuous Infusions:  Debbora PrestoMAGICK-Zanna Hawn, MD  [facility] Pager [phone]  If 7PM-7AM, please contact night-coverage [email or web address] Password [email or web address] 07/28/2013, 11:19 AM   LOS: 2 days

## 2013-07-28 NOTE — Progress Notes (Signed)
ANTIBIOTIC CONSULT NOTE - Follow up  Pharmacy Consult for vancomycin, cefepime Indication: HACP  No Known Allergies  Patient Measurements: Height: 5\' 8"  (172.7 cm) Weight: 139 lb 15.9 oz (63.5 kg) IBW/kg (Calculated) : 63.9  Vital Signs: Temp: 97.5 F (36.4 C) (01/24 1400) Temp src: Oral (01/24 1400) BP: 116/70 mmHg (01/24 1400) Pulse Rate: 75 (01/24 1400) Intake/Output from previous day: 01/23 0701 - 01/24 0700 In: 890 [P.O.:240; IV Piggyback:650] Out: -  Intake/Output from this shift: Total I/O In: 170 [P.O.:120; IV Piggyback:50] Out: -   Labs:  Recent Labs  07/26/13 1716 07/27/13 0745 07/28/13 0455  WBC 9.9 10.0 9.9  HGB 9.5* 8.6* 8.5*  PLT 380 376 424*  CREATININE 0.38* 0.31* 0.30*   Estimated Creatinine Clearance: 83.4 ml/min (by C-G formula based on Cr of 0.3).  Recent Labs  07/28/13 1629  VANCOTROUGH 7.8*     Assessment: 53 yo female admitted 1/22 with productive cough, SOB, diarrhea and fever. Note patient with recent admission for AECOPD, CAP treated with vanc, cefepime and zithromax during hospitalization and discharged home with 1 week course of Augmentin. To treat again with vancomycin and Cefepime for persistent HAP.  Antiinfectives 1/22 >> zosyn x 1 1/22 >> Cefepime x 8 days >>   1/22 >> Vancomycin x 8 days >>  Tmax: remains afebrile WBCs: WNL Renal: SCr 0.30-stable, using SCr 0.8 and actual wt, CrCl ~ 81 ml/min  Microbiology 1/22 blood x2: ngtd 1/22 urine: NGF  1/22 Urine strep pneumo Ag: POSITIVE 1/22 Legionella Ur Ag: negative 1/22 Influenza: negative  Vancomycin trough today below therapeutic range at 7.8 mcg/mL on 750mg  q12h   Goal of Therapy:  Vancomycin trough level 15-20 mcg/ml  Plan:  - increase vancomycin to 1500mg  IV q12h to start now - continue cefepime 1g IV q8h - vancomycin trough at steady state if indicated - follow-up clinical course, culture results, renal function - follow-up antibiotic de-escalation and  length of therapy  Thank you for the consult. Tomi BambergerJesse Cyriah Childrey, PharmD, BCPS Pager: [phone] Pharmacy: [phone] 07/28/2013 5:02 PM

## 2013-07-28 NOTE — Progress Notes (Signed)
ANTICOAGULATION CONSULT NOTE - Follow Up Consult  Pharmacy Consult for Warfarin Indication: Hx DVT/PE  No Known Allergies  Patient Measurements: Height: 5\' 8"  (172.7 cm) Weight: 139 lb 15.9 oz (63.5 kg) IBW/kg (Calculated) : 63.9  Vital Signs: Temp: 97.5 F (36.4 C) (01/24 0419) Temp src: Oral (01/24 0419) BP: 105/63 mmHg (01/24 0419) Pulse Rate: 79 (01/24 0419)  Labs:  Recent Labs  07/26/13 1716 07/27/13 0745 07/28/13 0455  HGB 9.5* 8.6* 8.5*  HCT 30.8* 27.5* 28.4*  PLT 380 376 424*  APTT 74*  --   --   LABPROT 29.4* 40.7* 37.9*  INR 2.91* 4.46* 4.06*  CREATININE 0.38* 0.31* 0.30*    Estimated Creatinine Clearance: 83.4 ml/min (by C-G formula based on Cr of 0.3).    Assessment: [age] yoF admitted 1/22 with possible HCAP.  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  Pharmacy consulted to continue warfarin dosing inpatient.  PTA dose is 5 mg daily and LD reported at 07/25/13  INR high again at 4.06 this morning, despite holding warfarin dose since 1/22.  (INR on admission was 3.33 1/22 afternoon, then down to 2.91 1/22 evening)  CBC: Hgb remains low at 8.5, but is stable from yesterday, Plt are increased    No bleeding or complications reported or documented.  Potential drug-drug interaction with Cefepime noted; may increase INR, but not expected to be a major change.   Goal of Therapy:  INR 2-3 Monitor platelets by anticoagulation protocol: Yes   Plan:   Hold warfarin today  Daily INR  Pharmacy to f/u daily.  Tomi BambergerJesse Jahmil Macleod, PharmD, BCPS Pager: [phone] Pharmacy: [phone] 07/28/2013 8:03 AM

## 2013-07-29 ENCOUNTER — Inpatient Hospital Stay (HOSPITAL_COMMUNITY): Payer: Managed Care, Other (non HMO)

## 2013-07-29 LAB — CBC
HCT: 28.2 % — ABNORMAL LOW (ref 36.0–46.0)
Hemoglobin: 8.3 g/dL — ABNORMAL LOW (ref 12.0–15.0)
MCH: 24.3 pg — ABNORMAL LOW (ref 26.0–34.0)
MCHC: 29.4 g/dL — AB (ref 30.0–36.0)
MCV: 82.7 fL (ref 78.0–100.0)
Platelets: 383 10*3/uL (ref 150–400)
RBC: 3.41 MIL/uL — ABNORMAL LOW (ref 3.87–5.11)
RDW: 23.9 % — AB (ref 11.5–15.5)
WBC: 6.9 10*3/uL (ref 4.0–10.5)

## 2013-07-29 LAB — PROTIME-INR
INR: 3.08 — AB (ref 0.00–1.49)
Prothrombin Time: 30.7 seconds — ABNORMAL HIGH (ref 11.6–15.2)

## 2013-07-29 LAB — GLUCOSE, CAPILLARY: Glucose-Capillary: 98 mg/dL (ref 70–99)

## 2013-07-29 LAB — BASIC METABOLIC PANEL
BUN: 8 mg/dL (ref 6–23)
CO2: 22 mEq/L (ref 19–32)
Calcium: 8.1 mg/dL — ABNORMAL LOW (ref 8.4–10.5)
Chloride: 107 mEq/L (ref 96–112)
Creatinine, Ser: 0.41 mg/dL — ABNORMAL LOW (ref 0.50–1.10)
GFR calc non Af Amer: >90 mL/min (ref >90)
Glucose, Bld: 82 mg/dL (ref 70–99)
POTASSIUM: 3.2 meq/L — AB (ref 3.7–5.3)
Sodium: 141 mEq/L (ref 137–147)

## 2013-07-29 MED ORDER — WARFARIN SODIUM 2.5 MG PO TABS
2.5000 mg | ORAL_TABLET | Freq: Once | ORAL | Status: AC
  Administered 2013-07-29: 2.5 mg via ORAL
  Filled 2013-07-29: qty 1

## 2013-07-29 MED ORDER — OXYCODONE HCL 5 MG PO TABS
15.0000 mg | ORAL_TABLET | ORAL | Status: DC | PRN
  Administered 2013-07-29 – 2013-07-30 (×3): 15 mg via ORAL
  Filled 2013-07-29 (×2): qty 3
  Filled 2013-07-29: qty 6

## 2013-07-29 MED ORDER — KETOROLAC TROMETHAMINE 30 MG/ML IJ SOLN
30.0000 mg | Freq: Four times a day (QID) | INTRAMUSCULAR | Status: DC | PRN
  Administered 2013-07-29: 30 mg via INTRAVENOUS
  Filled 2013-07-29: qty 1

## 2013-07-29 MED ORDER — POTASSIUM CHLORIDE CRYS ER 20 MEQ PO TBCR
40.0000 meq | EXTENDED_RELEASE_TABLET | Freq: Two times a day (BID) | ORAL | Status: AC
  Administered 2013-07-29 (×2): 40 meq via ORAL
  Filled 2013-07-29 (×3): qty 2

## 2013-07-29 MED ORDER — GUAIFENESIN 100 MG/5ML PO SYRP
200.0000 mg | ORAL_SOLUTION | ORAL | Status: DC | PRN
  Filled 2013-07-29: qty 10

## 2013-07-29 NOTE — Progress Notes (Signed)
Patient ID: Robin Prince, female   DOB: 02-13-1961, 53 y.o.   MRN: [id number]  [facility] PROGRESS NOTE  Robin Prince VHQ:[id number] DOB: May 26, 1961 DOA: 07/26/2013 PCP: Charlette Caffey, MD  Brief narrative:  53 yo female with hx of lupus anticoagulant disorder, COPD (not on home O2), DVT--chronically anticoagulated on coumadin--and recent admission for acute hypoxic respiratory failure due to acute COPD exac and CAP and adrenal insufficiency (1/2-07/09/13) presented to [facility] ED with main concern of several days duration of progressively worsening shortness of breath, worse with exertion, associated with fevers, chills, cough productive of yellow to green sputum, malaise, poor oral intake. On last hospitalization she was treated with vanc, cefipime, and zithromax and discharged home on tapering dose of prednisone, albuterol nebs and 1 week course of augmentin which she completed. She has not gotten much better and was referred by her PCP to go to ED. In ED, pt stable hemodynamically, PNA noted on CXR, RLL. [facility] asked to admit for further management of HCAP.   Assessment and Plan:  Principal Problem:  Acute respiratory failure with hypoxia  - secondary to HCAP, strep pneumo +  - placed on broad spectrum ABX and pt responding well  - sputum analysis ordered, urine legionella negative to date  - influenza panel negative  - repeat CXR in AM to ensure clearance  Active Problems:  HCAP (healthcare-associated pneumonia), strep pneumo +  - placed on Vancomycin and Maxipime, continue day #4 and naroow down once sputum analysis is finalized  - provide oxygen  - BD's as needed, solumedrol  - plan on tapering down solumedrol to Prednisone in AM COPD exacerbation  - management as noted above  - this is clinically stable  History of DVT of lower extremity  - INR 3 - pt needs her INR 3-4  Adrenal insufficiency   - monitor closely  - solumedrol started and will plan on tapering  down  Lupus anticoagulant disorder  Anemia of chronic disease  - Hg and Hct slightly down over 24 hours, ? Dilutional component  - no signs of active bleeding  - CBC in AM   Consultants:  None Procedures/Studies:  Dg Chest 2 View 07/26/2013 Patchy pneumonia in right lower lobe. Follow-up to resolution is recommended.  Antibiotics:  Vancomycin 01/22 -->  Maxipime 01/22 -->  Code Status: Full  Family Communication: Pt at bedside  Disposition Plan: Home when medically stable  HPI/Subjective: No events overnight.   Objective: Filed Vitals:   07/29/13 0621 07/29/13 0801 07/29/13 1445 07/29/13 1514  BP: 124/75  114/66   Pulse: 73  72   Temp: 98.1 F (36.7 C)  97.9 F (36.6 C)   TempSrc: Oral  Oral   Resp: 18  18   Height:      Weight: 63.1 kg (139 lb 1.8 oz)     SpO2: 96% 96% 94% 96%    Intake/Output Summary (Last 24 hours) at 07/29/13 1857 Last data filed at 07/29/13 1300  Gross per 24 hour  Intake    610 ml  Output      0 ml  Net    610 ml    Exam:   General:  Pt is alert, follows commands appropriately, not in acute distress  Cardiovascular: Regular rate and rhythm, S1/S2, no murmurs, no rubs, no gallops  Respiratory: Clear to auscultation bilaterally, no wheezing, rhonchi at bases   Abdomen: Soft, non tender, non distended, bowel sounds present, no guarding  Extremities: No edema, pulses DP  and PT palpable bilaterally  Neuro: Grossly nonfocal  Data Reviewed: Basic Metabolic Panel:  Recent Labs Lab 07/26/13 1345 07/26/13 1716 07/27/13 0745 07/28/13 0455 07/29/13 0509  NA 136* 138 140 140 141  K 3.9 3.9 3.8 5.1 3.2*  CL 98 101 106 106 107  CO2 25 24 25 23 22   GLUCOSE 149* 150* 125* 106* 82  BUN 7 6 7 9 8   CREATININE 0.39* 0.38* 0.31* 0.30* 0.41*  CALCIUM 8.9 8.6 8.8 8.7 8.1*  MG  --  1.7  --   --   --   PHOS  --  3.3  --   --   --    Liver Function Tests:  Recent Labs Lab 07/26/13 1345 07/26/13 1716 07/27/13 0745  AST 12 10 7   ALT 8  7 6   ALKPHOS 95 94 76  BILITOT 0.3 0.3 0.2*  PROT 7.5 7.1 6.4  ALBUMIN 2.8* 2.6* 2.4*   CBC:  Recent Labs Lab 07/26/13 1345 07/26/13 1716 07/27/13 0745 07/28/13 0455 07/29/13 0509  WBC 15.0* 9.9 10.0 9.9 6.9  NEUTROABS 14.0* 9.0*  --   --   --   HGB 9.8* 9.5* 8.6* 8.5* 8.3*  HCT 31.4* 30.8* 27.5* 28.4* 28.2*  MCV 78.5 79.6 79.3 81.4 82.7  PLT 390 380 376 424* 383   CBG:  Recent Labs Lab 07/27/13 0735 07/28/13 0724 07/29/13 0735  GLUCAP 119* 122* 98    Recent Results (from the past 240 hour(s))  CULTURE, BLOOD (ROUTINE X 2)     Status: None   Collection Time    07/26/13  2:00 PM      Result Value Range Status   Specimen Description BLOOD RIGHT WRIST   Final   Special Requests BOTTLES DRAWN AEROBIC AND ANAEROBIC 5ML   Final   Culture  Setup Time     Final   Value: 07/26/2013 20:35     Performed at [facility]   Culture     Final   Value:        BLOOD CULTURE RECEIVED NO GROWTH TO DATE CULTURE WILL BE HELD FOR 5 DAYS BEFORE ISSUING A FINAL NEGATIVE REPORT     Performed at [facility]   Report Status PENDING   Incomplete  CULTURE, BLOOD (ROUTINE X 2)     Status: None   Collection Time    07/26/13  2:50 PM      Result Value Range Status   Specimen Description BLOOD LEFT ARM   Final   Special Requests BOTTLES DRAWN AEROBIC AND ANAEROBIC 5ML   Final   Culture  Setup Time     Final   Value: 07/26/2013 20:36     Performed at [facility]   Culture     Final   Value:        BLOOD CULTURE RECEIVED NO GROWTH TO DATE CULTURE WILL BE HELD FOR 5 DAYS BEFORE ISSUING A FINAL NEGATIVE REPORT     Performed at [facility]   Report Status PENDING   Incomplete  URINE CULTURE     Status: None   Collection Time    07/26/13  9:01 PM      Result Value Range Status   Specimen Description URINE, CATHETERIZED   Final   Special Requests NONE   Final   Culture  Setup Time     Final   Value: 07/27/2013 03:47     Performed at [facility]    Colony Count  Final   Value: NO GROWTH     Performed at [facility]   Culture     Final   Value: NO GROWTH     Performed at [facility]   Report Status 07/28/2013 FINAL   Final     Scheduled Meds: . benzonatate  200 mg Oral TID  . ceFEPime (MAXIPIME) IV  1 g Intravenous Q8H  . DULoxetine  60 mg Oral Daily  . feeding supplement (ENSURE COMPLETE)  237 mL Oral BID BM  . fentaNYL  50 mcg Transdermal Q72H  . ferrous sulfate  325 mg Oral TID WC  . gabapentin  1,200 mg Oral QHS  . gabapentin  600 mg Oral BID  . ipratropium-albuterol  3 mL Nebulization Q6H  . methylPREDNISolone (SOLU-MEDROL) injection  40 mg Intravenous Daily  . potassium chloride  40 mEq Oral BID  . sodium chloride  3 mL Intravenous Q12H  . vancomycin  1,500 mg Intravenous Q12H  . vitamin B-12  1,000 mcg Oral Daily  . Vitamin D (Ergocalciferol)  50,000 Units Oral Q Fri  . Warfarin - Pharmacist Dosing Inpatient   Does not apply q1800   Continuous Infusions:    Debbora Presto, MD  [facility] Pager [phone]  If 7PM-7AM, please contact night-coverage [email or web address] Password [facility] 07/29/2013, 6:57 PM   LOS: 3 days

## 2013-07-29 NOTE — Progress Notes (Signed)
ANTICOAGULATION CONSULT NOTE - Follow Up Consult  Pharmacy Consult for Warfarin Indication: Hx DVT/PE  No Known Allergies  Patient Measurements: Height: 5\' 8"  (172.7 cm) Weight: 139 lb 1.8 oz (63.1 kg) IBW/kg (Calculated) : 63.9  Vital Signs: Temp: 98.1 F (36.7 C) (01/25 0621) Temp src: Oral (01/25 0621) BP: 124/75 mmHg (01/25 0621) Pulse Rate: 73 (01/25 0621)  Labs:  Recent Labs  07/26/13 1716 07/27/13 0745 07/28/13 0455 07/29/13 0509  HGB 9.5* 8.6* 8.5* 8.3*  HCT 30.8* 27.5* 28.4* 28.2*  PLT 380 376 424* 383  APTT 74*  --   --   --   LABPROT 29.4* 40.7* 37.9* 30.7*  INR 2.91* 4.46* 4.06* 3.08*  CREATININE 0.38* 0.31* 0.30* 0.41*    Estimated Creatinine Clearance: 82.9 ml/min (by C-G formula based on Cr of 0.41).    Assessment: [age] yoF admitted 1/22 with possible HCAP.  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  Pharmacy consulted to continue warfarin dosing inpatient.  PTA dose is 5 mg daily and LD reported at 07/25/13  INR high again, but decreasing at 3.08 this morning, despite holding warfarin dose since 1/22.  (INR on admission was 3.33 1/22 afternoon, then down to 2.91 1/22 evening)  CBC: Hgb remains low at 8.3, but is essentially stable from yesterday, Plt WNL  No bleeding or complications reported or documented.  Potential drug-drug interaction with Cefepime noted; may increase INR, but not expected to be a major change.   Goal of Therapy:  INR 2-3 Monitor platelets by anticoagulation protocol: Yes   Plan:   Warfarin 2.5mg  tonight to prevent INR from falling below therapeutic range  Daily INR  Pharmacy to f/u daily.  Tomi BambergerJesse Darryl Blumenstein, PharmD, BCPS Pager: [phone] Pharmacy: [phone] 07/29/2013 11:46 AM

## 2013-07-30 LAB — BASIC METABOLIC PANEL
BUN: 8 mg/dL (ref 6–23)
CALCIUM: 8.1 mg/dL — AB (ref 8.4–10.5)
CO2: 22 mEq/L (ref 19–32)
Chloride: 107 mEq/L (ref 96–112)
Creatinine, Ser: 0.41 mg/dL — ABNORMAL LOW (ref 0.50–1.10)
Glucose, Bld: 83 mg/dL (ref 70–99)
POTASSIUM: 4.1 meq/L (ref 3.7–5.3)
Sodium: 143 mEq/L (ref 137–147)

## 2013-07-30 LAB — GLUCOSE, CAPILLARY: Glucose-Capillary: 93 mg/dL (ref 70–99)

## 2013-07-30 LAB — CBC
HEMATOCRIT: 30.1 % — AB (ref 36.0–46.0)
Hemoglobin: 8.9 g/dL — ABNORMAL LOW (ref 12.0–15.0)
MCH: 24.4 pg — AB (ref 26.0–34.0)
MCHC: 29.6 g/dL — AB (ref 30.0–36.0)
MCV: 82.5 fL (ref 78.0–100.0)
Platelets: 447 10*3/uL — ABNORMAL HIGH (ref 150–400)
RBC: 3.65 MIL/uL — ABNORMAL LOW (ref 3.87–5.11)
RDW: 23.7 % — AB (ref 11.5–15.5)
WBC: 7.5 10*3/uL (ref 4.0–10.5)

## 2013-07-30 LAB — PROTIME-INR
INR: 2 — AB (ref 0.00–1.49)
Prothrombin Time: 22.1 seconds — ABNORMAL HIGH (ref 11.6–15.2)

## 2013-07-30 MED ORDER — WARFARIN SODIUM 5 MG PO TABS
5.0000 mg | ORAL_TABLET | Freq: Once | ORAL | Status: DC
  Filled 2013-07-30: qty 1

## 2013-07-30 MED ORDER — LEVOFLOXACIN 500 MG PO TABS: 500.0000 mg | ORAL_TABLET | Freq: Every day | ORAL | Status: AC

## 2013-07-30 MED ORDER — OXYCODONE HCL 15 MG PO TABS: 15.0000 mg | ORAL_TABLET | Freq: Four times a day (QID) | ORAL | Status: AC | PRN

## 2013-07-30 NOTE — Progress Notes (Signed)
ANTICOAGULATION CONSULT NOTE - Follow Up Consult  Pharmacy Consult for Warfarin Indication: Hx DVT/PE  No Known Allergies  Patient Measurements: Height: 5\' 8"  (172.7 cm) Weight: 139 lb 1.8 oz (63.1 kg) IBW/kg (Calculated) : 63.9  Vital Signs: Temp: 98 F (36.7 C) (01/26 0529) Temp src: Oral (01/26 0529) BP: 108/63 mmHg (01/26 0529) Pulse Rate: 72 (01/26 0529)  Labs:  Recent Labs  07/28/13 0455 07/29/13 0509 07/30/13 0550  HGB 8.5* 8.3* 8.9*  HCT 28.4* 28.2* 30.1*  PLT 424* 383 447*  LABPROT 37.9* 30.7* 22.1*  INR 4.06* 3.08* 2.00*  CREATININE 0.30* 0.41* 0.41*    Estimated Creatinine Clearance: 82.9 ml/min (by C-G formula based on Cr of 0.41).    Assessment: [age] yoF admitted 1/22 with possible HCAP.  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  Pharmacy consulted to continue warfarin dosing inpatient.  PTA dose is 5 mg daily and LD reported at 07/25/13  INR finally therapeutic but on low end of normal range after being > 4 on 1/24.  Note first dose of warfarin given last night 1/25 since admission of 2.5mg   CBC: Hgb remains low but stable  No bleeding or complications reported or documented.  Potential drug-drug interaction with Cefepime noted; may increase INR, but not expected to be a major change.  Note patient now on regular diet and eating some   Goal of Therapy:  INR 2-3 Monitor platelets by anticoagulation protocol: Yes   Plan:  1) INR dropping but still within normal goal range - 5mg  today 2) Note INR likely to be subtherapeutic tomorrow AM regardless 3) If discharged, would recommend restarting home regimen of 5mg  daily 4) Daily INR   Hessie KnowsJustin M Vuong Musa, PharmD, BCPS Pager [phone] 07/30/2013 11:13 AM

## 2013-07-30 NOTE — Discharge Summary (Signed)
Physician Discharge Summary  Robin MuttersDarlene T Nembhard KGM:[id number] DOB: 09/16/1960 DOA: 07/26/2013  PCP: Charlette CaffeyParuchuri, Lakshmi P, MD  Admit date: 07/26/2013 Discharge date: 07/30/2013  Recommendations for Outpatient Follow-up:  1. Pt will need to follow up with PCP in 2-3 weeks post discharge 2. Please obtain BMP to evaluate electrolytes and kidney function 3. Please also check CBC to evaluate Hg and Hct levels 4. Please note the patient was discharged on Levaquin to complete therapy for 12 more days postdischarge  Discharge Diagnoses: Acute respiratory failure with hypoxia secondary to strep pneumo pneumonia Principal Problem:   Acute respiratory failure with hypoxia Active Problems:   HCAP (healthcare-associated pneumonia)   COPD exacerbation   History of DVT of lower extremity   Adrenal insufficiency   Lupus anticoagulant disorder   Anemia of chronic disease   Supratherapeutic INR    Discharge Condition: Stable  Diet recommendation: Heart healthy diet discussed in details   Brief narrative:  53 yo female with hx of lupus anticoagulant disorder, COPD (not on home O2), DVT--chronically anticoagulated on coumadin--and recent admission for acute hypoxic respiratory failure due to acute COPD exac and CAP and adrenal insufficiency (1/2-07/09/13) [facility] with main concern of several days duration of progressively worsening shortness of breath, worse with exertion, associated with fevers, chills, cough productive of yellow to green sputum, malaise, poor oral intake. On last hospitalization she was treated with vanc, cefipime, and zithromax and discharged home on tapering dose of prednisone, albuterol nebs and 1 week course of augmentin which she completed. She has not gotten much better and was referred by her PCP to go to ED. In ED, pt stable hemodynamically, PNA noted on CXR, RLL. TRH asked to admit for further management of HCAP.   Assessment and Plan:  Principal Problem:  Acute  respiratory failure with hypoxia  - secondary to HCAP, strep pneumo +  - placed on broad spectrum ABX and pt responding well  - sputum analysis ordered, urine legionella negative to date  - influenza panel negative  - Patient was to go home today, will continue Levaquin by mouth for 12 more days postdischarge Active Problems:  HCAP (healthcare-associated pneumonia), strep pneumo +  - placed on Vancomycin and Maxipime, patient has received 4 days of broad-spectrum antibiotics, successfully transitioned to Levaquin by mouth to complete therapy for 12 more days postdischarge - provide oxygen as needed - BD's as needed, solumedrol provided while inpatient and patient has responded well - plan on tapering down Prednisone upon discharge as noted below COPD exacerbation  - management as noted above  - this is clinically stable  History of DVT of lower extremity  - INR 3  - pt needs her INR 3-4  Adrenal insufficiency  - monitor closely  - solumedrol started  - Patient transitioned to prednisone and this can be tapered down to her home dose Lupus anticoagulant disorder  Anemia of chronic disease  - Hg and Hct slightly down over 24 hours, ? Dilutional component  - no signs of active bleeding   Consultants:  None Procedures/Studies:  Dg Chest 2 View 07/26/2013 Patchy pneumonia in right lower lobe. Follow-up to resolution is recommended.  Antibiotics:  Vancomycin 01/22 --> 07/30/2013 Maxipime 01/22 --> 07/30/2013 Levaquin 07/30/2013 to complete therapy for 12 more days post discharge  Code Status: Full  Family Communication: Pt at bedside   Discharge Exam: Filed Vitals:   07/30/13 0529  BP: 108/63  Pulse: 72  Temp: 98 F (36.7 C)  Resp: 20  Filed Vitals:   07/29/13 1514 07/29/13 2017 07/29/13 2026 07/30/13 0529  BP:  109/69  108/63  Pulse:  71  72  Temp:  98.1 F (36.7 C)  98 F (36.7 C)  TempSrc:  Oral  Oral  Resp:  18  20  Height:      Weight:      SpO2: 96% 97% 97%  98%    General: Pt is alert, follows commands appropriately, not in acute distress Cardiovascular: Regular rate and rhythm, S1/S2 +, no murmurs, no rubs, no gallops Respiratory: Clear to auscultation bilaterally, no wheezing, no crackles, no rhonchi Abdominal: Soft, non tender, non distended, bowel sounds +, no guarding Extremities: no edema, no cyanosis, pulses palpable bilaterally DP and PT Neuro: Grossly nonfocal  Discharge Instructions  Discharge Orders   Future Orders Complete By Expires   Diet - low sodium heart healthy  As directed    Increase activity slowly  As directed        Medication List         acetaminophen 500 MG tablet  Commonly known as:  TYLENOL  Take 500 mg by mouth every 6 (six) hours as needed for pain.     ADVAIR DISKUS 500-50 MCG/DOSE Aepb  Generic drug:  Fluticasone-Salmeterol  Inhale 1 puff into the lungs 2 (two) times daily.     albuterol 108 (90 BASE) MCG/ACT inhaler  Commonly known as:  PROVENTIL HFA;VENTOLIN HFA  Inhale 2 puffs into the lungs every 4 (four) hours as needed for wheezing or shortness of breath.     benzonatate 200 MG capsule  Commonly known as:  TESSALON  Take 1 capsule (200 mg total) by mouth 3 (three) times daily.     cyanocobalamin 1000 MCG tablet  Take 1 tablet (1,000 mcg total) by mouth daily.     DULoxetine 60 MG capsule  Commonly known as:  CYMBALTA  Take 60 mg by mouth daily.     fentaNYL 50 MCG/HR  Commonly known as:  DURAGESIC - dosed mcg/hr  Place 1 patch onto the skin every 3 (three) days.     ferrous sulfate 325 (65 FE) MG tablet  Take 1 tablet (325 mg total) by mouth 3 (three) times daily with meals.     gabapentin 600 MG tablet  Commonly known as:  NEURONTIN  Take 600-1,200 mg by mouth 3 (three) times daily. Take 600mg  in the morning, 600mg  in the afternoon and 1200mg  at bedtime     guaiFENesin 600 MG 12 hr tablet  Commonly known as:  MUCINEX  Take 600 mg by mouth 2 (two) times daily as needed for  cough.     ipratropium-albuterol 0.5-2.5 (3) MG/3ML Soln  Commonly known as:  DUONEB  Inhale 3 mLs into the lungs every 4 (four) hours as needed (wheezing).     levofloxacin 500 MG tablet  Commonly known as:  LEVAQUIN  Take 1 tablet (500 mg total) by mouth daily.     multivitamin with minerals Tabs tablet  Take 1 tablet by mouth daily.     oxyCODONE 15 MG immediate release tablet  Commonly known as:  ROXICODONE  Take 1 tablet (15 mg total) by mouth every 6 (six) hours as needed for pain.     predniSONE 10 MG tablet  Commonly known as:  DELTASONE  Take 5 tabs daily x 2 days, 4 tabs x 2 days, 3 tabs daily x 2 days, 2 tabs daily x 2 days, 1 tab daily x 2 days,  then half tab daily.     VITAMIN C PO  Take 1 tablet by mouth daily.     warfarin 5 MG tablet  Commonly known as:  COUMADIN  Take 5 mg by mouth at bedtime.           Follow-up Information   Follow up with Paruchuri, Janace Hoard, MD. Schedule an appointment as soon as possible for a visit in 1 week.   Specialty:  Internal Medicine   Contact information:   [address] [place] [address] [phone]       Follow up with Debbora Presto, MD. (As needed call my cell phone [phone])    Specialty:  Internal Medicine   Contact information:   [address] [place] [address] [phone]        The results of significant diagnostics from this hospitalization (including imaging, microbiology, ancillary and laboratory) are listed below for reference.     Microbiology: Recent Results (from the past 240 hour(s))  CULTURE, BLOOD (ROUTINE X 2)     Status: None   Collection Time    07/26/13  2:00 PM      Result Value Range Status   Specimen Description BLOOD RIGHT WRIST   Final   Special Requests BOTTLES DRAWN AEROBIC AND ANAEROBIC   Final   Culture  Setup Time     Final   Value: 07/26/2013 20:35     Performed at [facility]   Culture     Final   Value:        BLOOD CULTURE  RECEIVED NO GROWTH TO DATE CULTURE WILL BE HELD FOR 5 DAYS BEFORE ISSUING A FINAL NEGATIVE REPORT     Performed at [facility]   Report Status PENDING   Incomplete  CULTURE, BLOOD (ROUTINE X 2)     Status: None   Collection Time    07/26/13  2:50 PM      Result Value Range Status   Specimen Description BLOOD LEFT ARM   Final   Special Requests BOTTLES DRAWN AEROBIC AND ANAEROBIC   Final   Culture  Setup Time     Final   Value: 07/26/2013 20:36     Performed at [facility]   Culture     Final   Value:        BLOOD CULTURE RECEIVED NO GROWTH TO DATE CULTURE WILL BE HELD FOR 5 DAYS BEFORE ISSUING A FINAL NEGATIVE REPORT     Performed at [facility]   Report Status PENDING   Incomplete  URINE CULTURE     Status: None   Collection Time    07/26/13  9:01 PM      Result Value Range Status   Specimen Description URINE, CATHETERIZED   Final   Special Requests NONE   Final   Culture  Setup Time     Final   Value: 07/27/2013 03:47     Performed at [facility] Count     Final   Value: NO GROWTH     Performed at [facility]   Culture     Final   Value: NO GROWTH     Performed at [facility]   Report Status 07/28/2013 FINAL   Final     Labs: Basic Metabolic Panel:  Recent Labs Lab 07/26/13 1716 07/27/13 0745 07/28/13 0455 07/29/13 0509 07/30/13 0550  NA 138 140 140 141 143  K 3.9  3.8 5.1 3.2* 4.1  CL 101 106 106 107 107  CO2 24 25 23 22 22   GLUCOSE 150* 125* 106* 82 83  BUN 6 7 9 8 8   CREATININE 0.38* 0.31* 0.30* 0.41* 0.41*  CALCIUM 8.6 8.8 8.7 8.1* 8.1*  MG 1.7  --   --   --   --   PHOS 3.3  --   --   --   --    Liver Function Tests:  Recent Labs Lab 07/26/13 1345 07/26/13 1716 07/27/13 0745  AST 12 10 7   ALT 8 7 6   ALKPHOS 95 94 76  BILITOT 0.3 0.3 0.2*  PROT 7.5 7.1 6.4  ALBUMIN 2.8* 2.6* 2.4*   CBC:  Recent Labs Lab 07/26/13 1345 07/26/13 1716 07/27/13 0745 07/28/13 0455  07/29/13 0509 07/30/13 0550  WBC 15.0* 9.9 10.0 9.9 6.9 7.5  NEUTROABS 14.0* 9.0*  --   --   --   --   HGB 9.8* 9.5* 8.6* 8.5* 8.3* 8.9*  HCT 31.4* 30.8* 27.5* 28.4* 28.2* 30.1*  MCV 78.5 79.6 79.3 81.4 82.7 82.5  PLT 390 380 376 424* 383 447*   CBG:  Recent Labs Lab 07/27/13 0735 07/28/13 0724 07/29/13 0735 07/30/13 0746  GLUCAP 119* 122* 98 93   SIGNED: Time coordinating discharge: Over 30 minutes  MAGICK-Theodoros Stjames, MD  [facility] 07/30/2013, 1:25 PM Pager [phone]  If 7PM-7AM, please contact night-coverage [email or web address] Password [email or web address]

## 2013-07-30 NOTE — Progress Notes (Signed)
Pt c/o of pain in her IV. Will continue to monitor. IV flushes fine and blood return noted.

## 2013-07-30 NOTE — Discharge Instructions (Signed)

## 2013-08-01 LAB — CULTURE, BLOOD (ROUTINE X 2)
Culture: NO GROWTH
Culture: NO GROWTH

## 2014-11-06 IMAGING — CR DG TIBIA/FIBULA 2V*R*
2 series · 2 of 2 positions shown · non-contrast
Comparison: No priors.

CLINICAL DATA: Pain in the right leg.

EXAM:
RIGHT TIBIA AND FIBULA - 2 VIEW

[x tib-fib ap right]
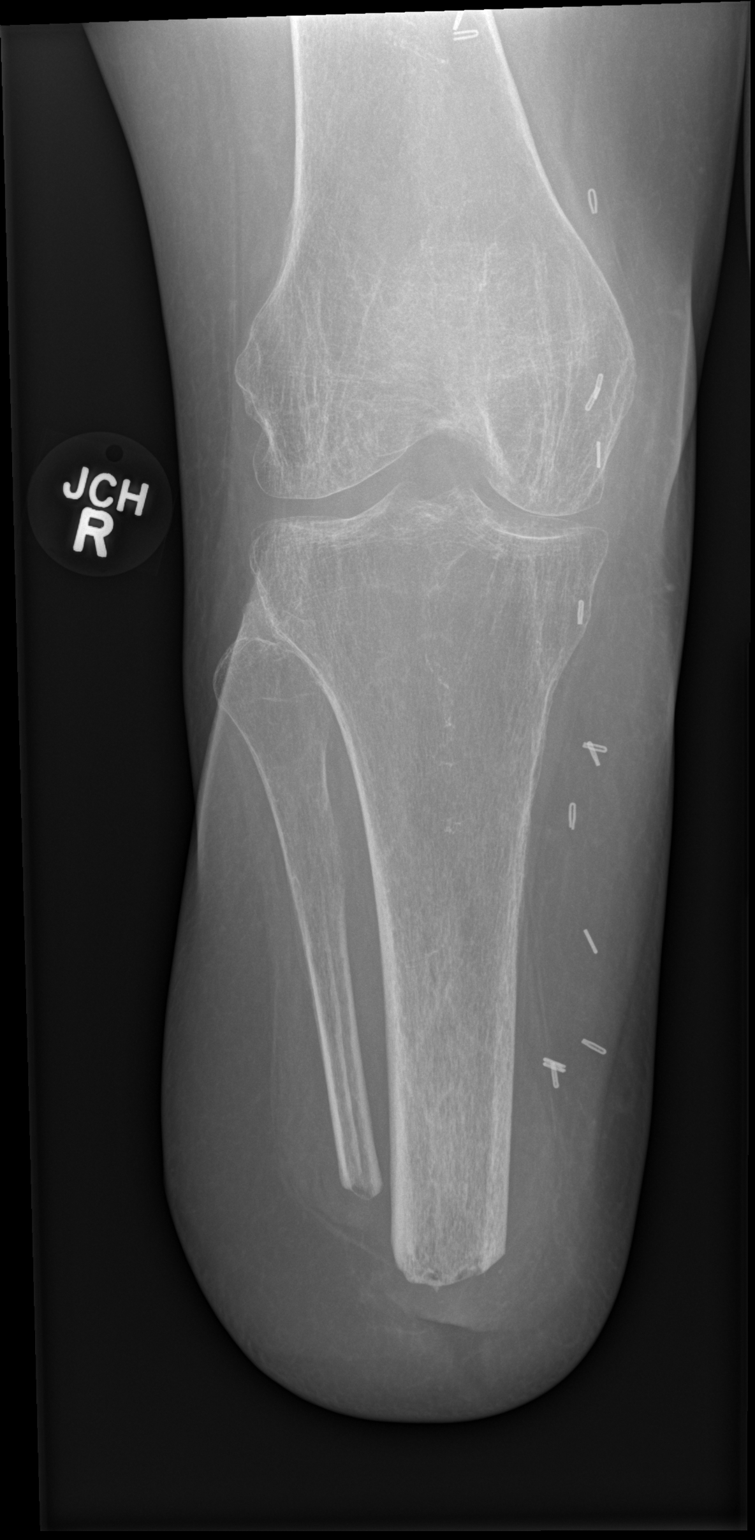

[x tib-fib lat right]
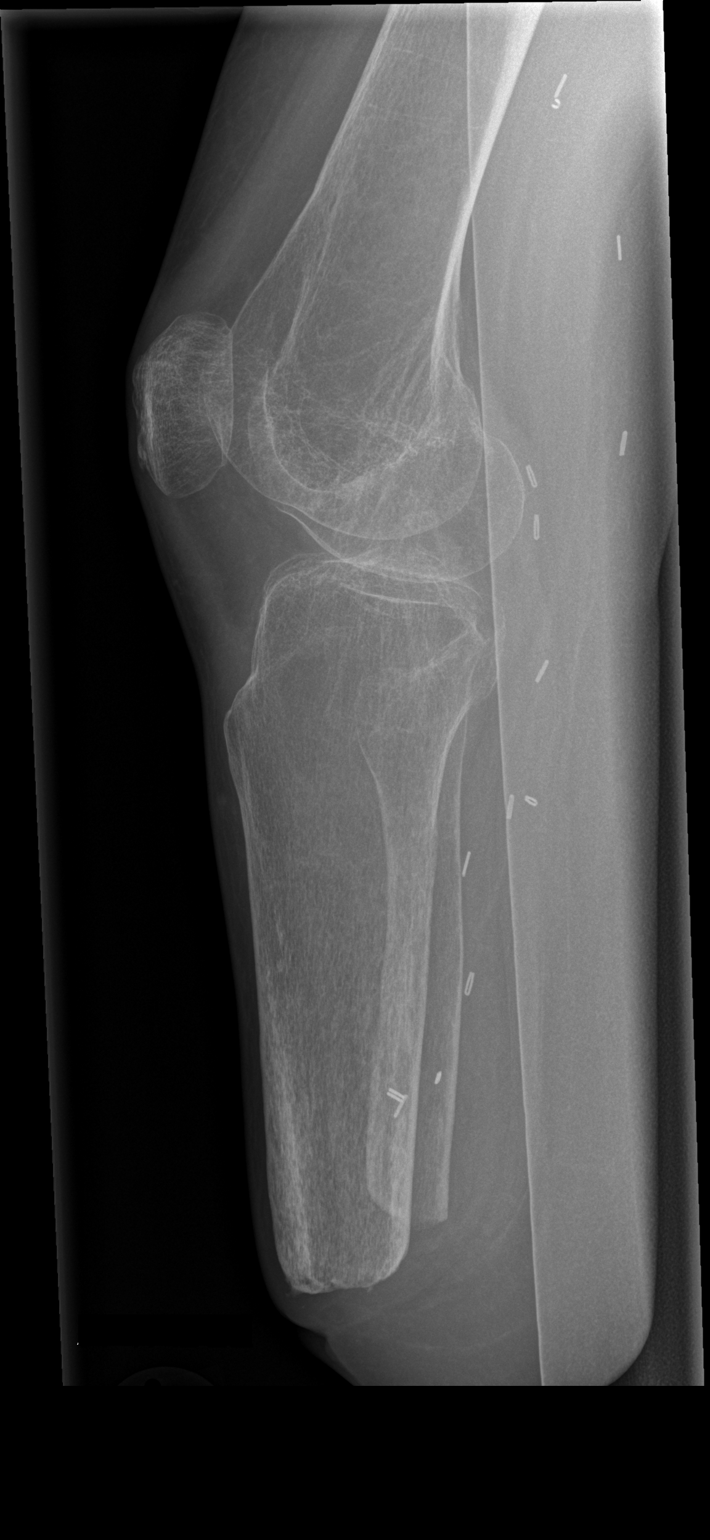

[2 of 2 positions shown; findings below may reference images not displayed]

FINDINGS: Status post right below the knee amputation. The proximal 3rd of the
right tibia and fibula remain. There is some mild irregularity of
the tibial stump, without frank osteolysis. Soft tissues appear
unremarkable. Numerous surgical clips are noted along the medial
aspect of the right leg and thigh. Bones appear osteopenic. Acute
displaced fracture.
IMPRESSION: 1. Status post right below-the-knee amputation. No definite acute
findings.
2. Osteopenia.

## 2015-01-07 IMAGING — CR DG CHEST 2V
2 series · 2 of 2 positions shown · non-contrast
Comparison: 07/06/2013

CLINICAL DATA: Pneumonia

EXAM:
CHEST  2 VIEW

[w chest lat]
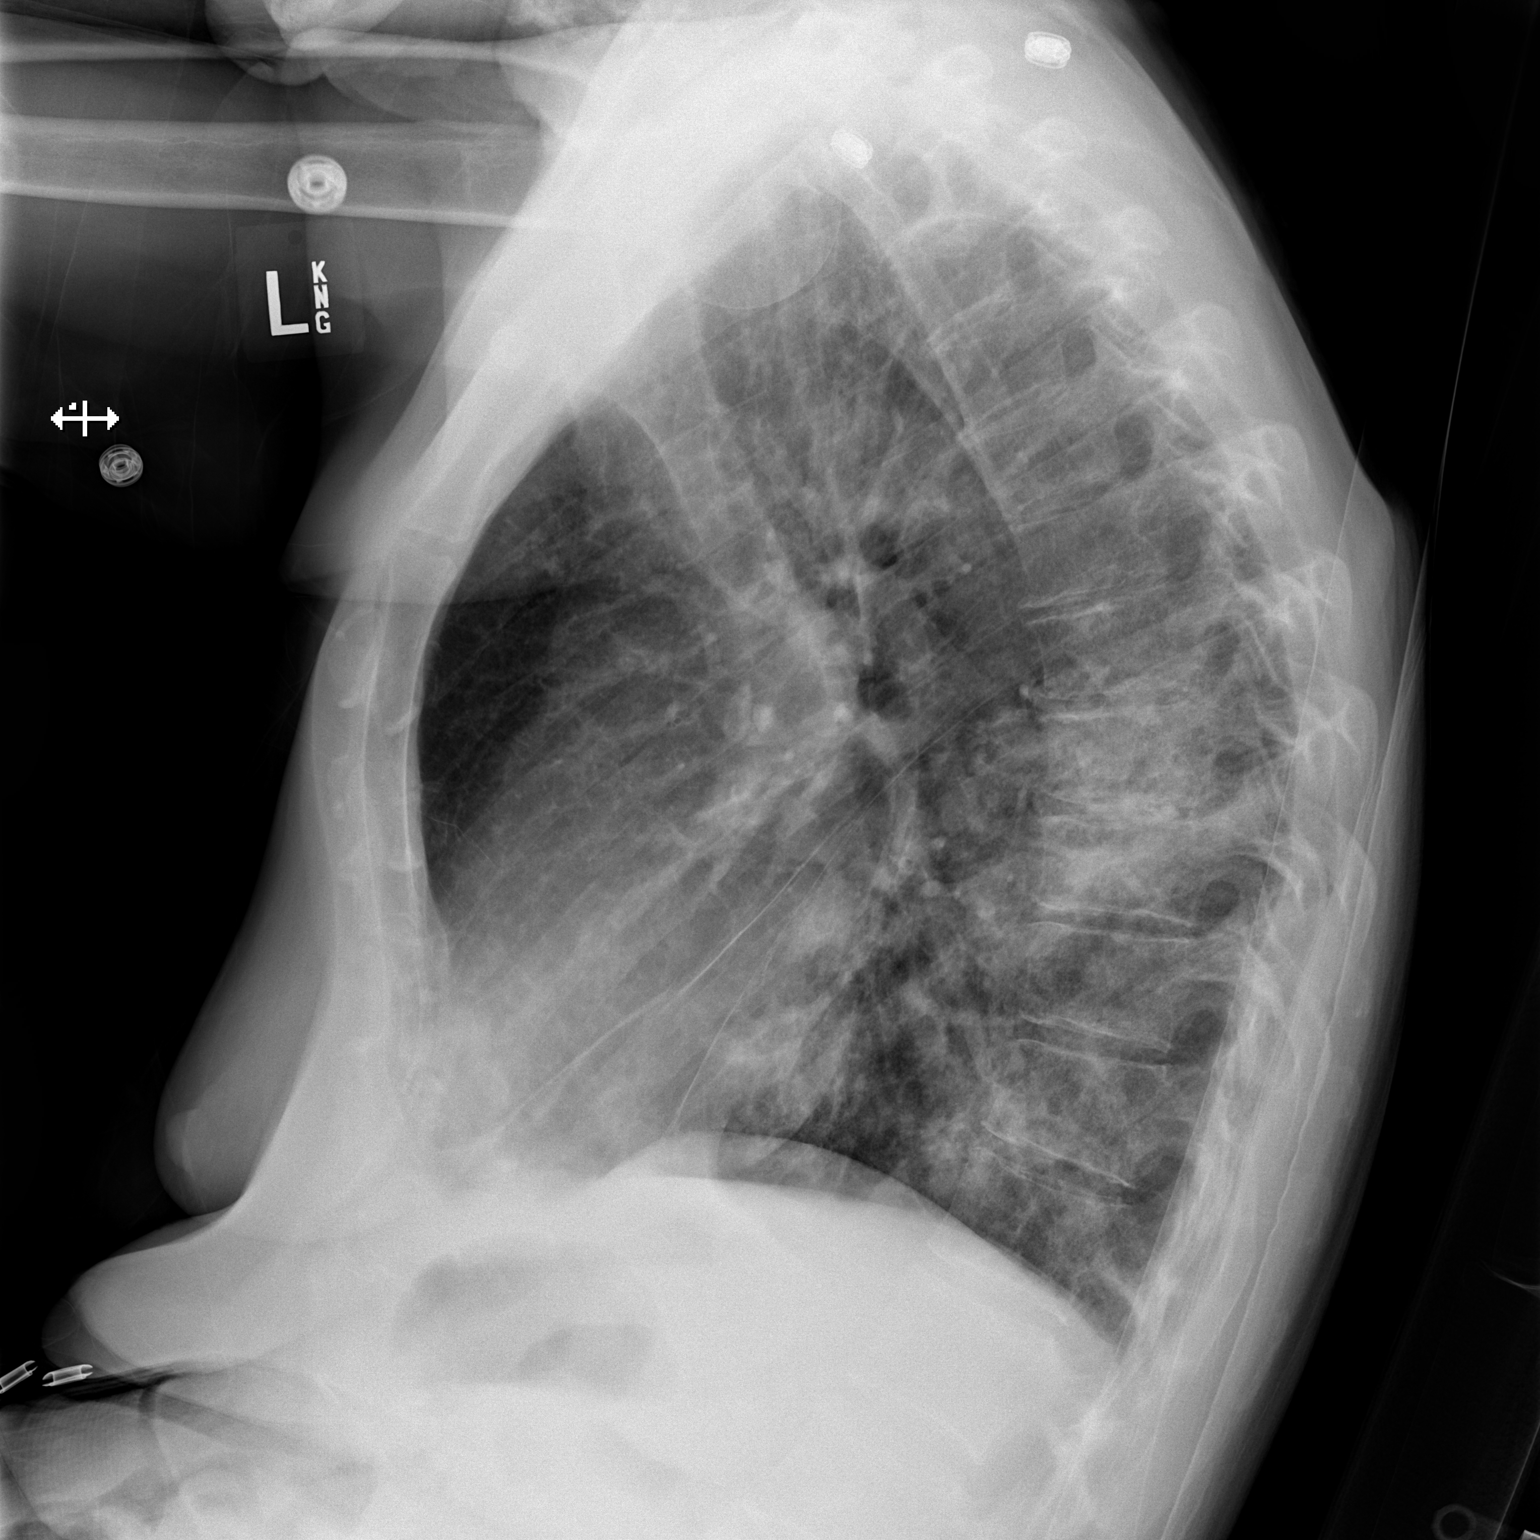

[x chest ap]
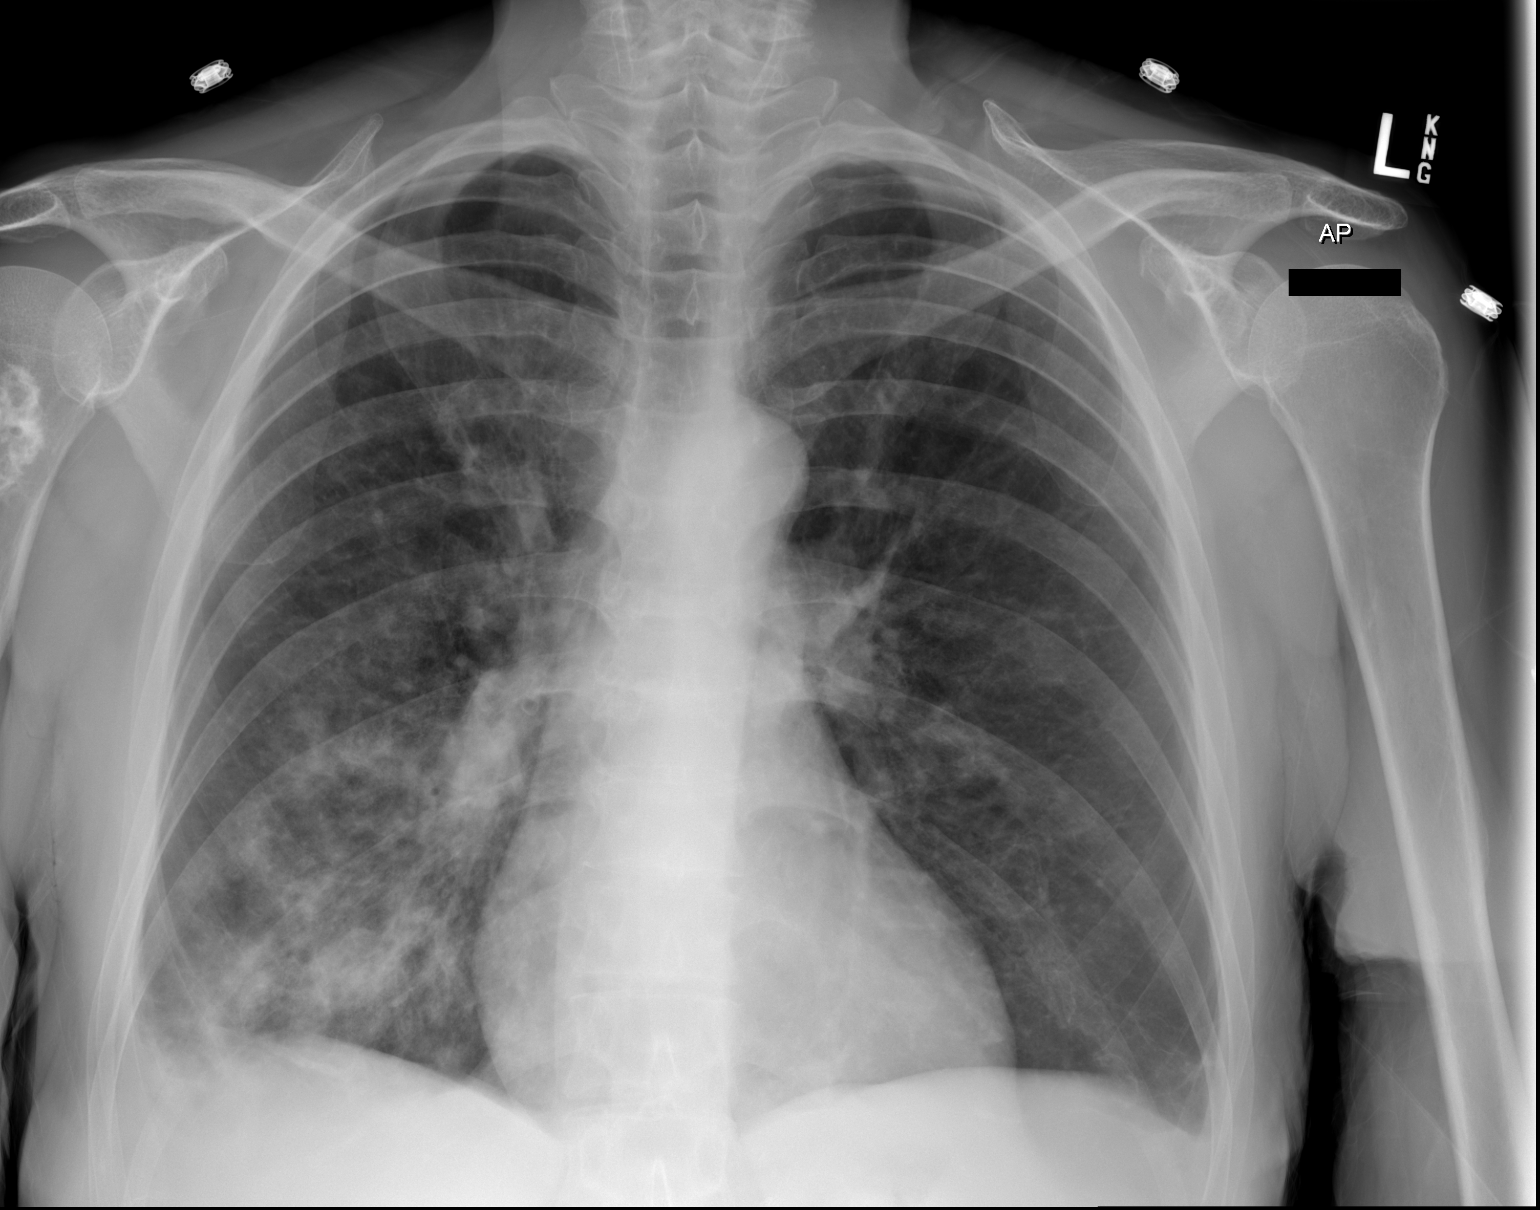

[2 of 2 positions shown; findings below may reference images not displayed]

FINDINGS: Cardiomediastinal silhouette is stable. There is patchy pneumonia in
right lower lobe. Follow-up to resolution is recommended. Bony
thorax is stable. Left lung is clear.
IMPRESSION: Patchy pneumonia in right lower lobe. Follow-up to resolution is
recommended.

## 2016-05-05 DEATH — deceased
